# Patient Record
Sex: Female | Born: 1975 | Hispanic: Yes | Marital: Single | Smoking: Never smoker
Health system: Southern US, Community
[De-identification: ages and names within clinical notes are randomized; demographics above are authoritative.]

## PROBLEM LIST (undated history)

## (undated) ENCOUNTER — Inpatient Hospital Stay (HOSPITAL_COMMUNITY): Payer: Self-pay

## (undated) DIAGNOSIS — O039 Complete or unspecified spontaneous abortion without complication: Secondary | ICD-10-CM

## (undated) DIAGNOSIS — D649 Anemia, unspecified: Secondary | ICD-10-CM

## (undated) DIAGNOSIS — R519 Headache, unspecified: Secondary | ICD-10-CM

## (undated) DIAGNOSIS — R001 Bradycardia, unspecified: Secondary | ICD-10-CM

## (undated) DIAGNOSIS — T7840XA Allergy, unspecified, initial encounter: Secondary | ICD-10-CM

## (undated) DIAGNOSIS — N189 Chronic kidney disease, unspecified: Secondary | ICD-10-CM

## (undated) DIAGNOSIS — I8290 Acute embolism and thrombosis of unspecified vein: Secondary | ICD-10-CM

## (undated) DIAGNOSIS — R51 Headache: Secondary | ICD-10-CM

## (undated) HISTORY — PX: UPPER GI ENDOSCOPY: SHX6162

## (undated) HISTORY — DX: Acute embolism and thrombosis of unspecified vein: I82.90

## (undated) HISTORY — DX: Allergy, unspecified, initial encounter: T78.40XA

## (undated) HISTORY — PX: OVARIAN CYST SURGERY: SHX726

## (undated) HISTORY — PX: CHOLECYSTECTOMY: SHX55

---

## 2011-01-21 DIAGNOSIS — I8290 Acute embolism and thrombosis of unspecified vein: Secondary | ICD-10-CM

## 2011-01-21 HISTORY — DX: Acute embolism and thrombosis of unspecified vein: I82.90

## 2016-02-21 HISTORY — PX: DILATION AND CURETTAGE OF UTERUS: SHX78

## 2016-09-11 ENCOUNTER — Inpatient Hospital Stay (HOSPITAL_COMMUNITY): Payer: Medicaid Other

## 2016-09-11 ENCOUNTER — Inpatient Hospital Stay (HOSPITAL_COMMUNITY)
Admission: AD | Admit: 2016-09-11 | Discharge: 2016-09-11 | Disposition: A | Discharge status: Home / Self Care | Payer: Medicaid Other | Source: Ambulatory Visit | Attending: Obstetrics & Gynecology | Admitting: Obstetrics & Gynecology

## 2016-09-11 ENCOUNTER — Encounter (HOSPITAL_COMMUNITY): Payer: Self-pay | Admitting: *Deleted

## 2016-09-11 DIAGNOSIS — O209 Hemorrhage in early pregnancy, unspecified: Secondary | ICD-10-CM | POA: Insufficient documentation

## 2016-09-11 DIAGNOSIS — O021 Missed abortion: Secondary | ICD-10-CM

## 2016-09-11 DIAGNOSIS — D682 Hereditary deficiency of other clotting factors: Secondary | ICD-10-CM | POA: Insufficient documentation

## 2016-09-11 DIAGNOSIS — N189 Chronic kidney disease, unspecified: Secondary | ICD-10-CM | POA: Diagnosis not present

## 2016-09-11 DIAGNOSIS — R109 Unspecified abdominal pain: Secondary | ICD-10-CM

## 2016-09-11 DIAGNOSIS — Z3A08 8 weeks gestation of pregnancy: Secondary | ICD-10-CM | POA: Diagnosis not present

## 2016-09-11 DIAGNOSIS — O034 Incomplete spontaneous abortion without complication: Secondary | ICD-10-CM | POA: Insufficient documentation

## 2016-09-11 DIAGNOSIS — D699 Hemorrhagic condition, unspecified: Secondary | ICD-10-CM

## 2016-09-11 DIAGNOSIS — O26891 Other specified pregnancy related conditions, first trimester: Secondary | ICD-10-CM

## 2016-09-11 DIAGNOSIS — O26831 Pregnancy related renal disease, first trimester: Secondary | ICD-10-CM | POA: Insufficient documentation

## 2016-09-11 DIAGNOSIS — O99111 Other diseases of the blood and blood-forming organs and certain disorders involving the immune mechanism complicating pregnancy, first trimester: Secondary | ICD-10-CM | POA: Diagnosis not present

## 2016-09-11 HISTORY — DX: Chronic kidney disease, unspecified: N18.9

## 2016-09-11 HISTORY — DX: Anemia, unspecified: D64.9

## 2016-09-11 HISTORY — DX: Complete or unspecified spontaneous abortion without complication: O03.9

## 2016-09-11 HISTORY — DX: Bradycardia, unspecified: R00.1

## 2016-09-11 LAB — WET PREP, GENITAL
Clue Cells Wet Prep HPF POC: NONE SEEN
SPERM: NONE SEEN
TRICH WET PREP: NONE SEEN
Yeast Wet Prep HPF POC: NONE SEEN

## 2016-09-11 LAB — ABO/RH: ABO/RH(D): O POS

## 2016-09-11 LAB — URINALYSIS, ROUTINE W REFLEX MICROSCOPIC
Bilirubin Urine: NEGATIVE
Glucose, UA: NEGATIVE mg/dL
Hgb urine dipstick: NEGATIVE
KETONES UR: NEGATIVE mg/dL
Leukocytes, UA: NEGATIVE
Nitrite: NEGATIVE
PROTEIN: 30 mg/dL — AB
Specific Gravity, Urine: 1.024 (ref 1.005–1.030)
pH: 8 (ref 5.0–8.0)

## 2016-09-11 LAB — CBC
HEMATOCRIT: 33.5 % — AB (ref 36.0–46.0)
HEMOGLOBIN: 11.3 g/dL — AB (ref 12.0–15.0)
MCH: 27.2 pg (ref 26.0–34.0)
MCHC: 33.7 g/dL (ref 30.0–36.0)
MCV: 80.5 fL (ref 78.0–100.0)
Platelets: 211 10*3/uL (ref 150–400)
RBC: 4.16 MIL/uL (ref 3.87–5.11)
RDW: 14.1 % (ref 11.5–15.5)
WBC: 3.9 10*3/uL — AB (ref 4.0–10.5)

## 2016-09-11 LAB — POCT PREGNANCY, URINE: Preg Test, Ur: POSITIVE — AB

## 2016-09-11 LAB — HCG, QUANTITATIVE, PREGNANCY: hCG, Beta Chain, Quant, S: 112694 m[IU]/mL — ABNORMAL HIGH (ref <5)

## 2016-09-11 NOTE — Discharge Instructions (Signed)
Aborto incompleto (Incomplete Miscarriage) Un aborto espontneo es la prdida repentina de un beb en gestacin (feto) antes de la semana 20 del embarazo. En un aborto espontneo, partes del feto o la placenta (alumbramiento) permanecen en el cuerpo. El aborto espontneo puede ser Ardelia Mems experiencia que afecte emocionalmente a Geologist, engineering. Hable con su mdico si tiene preguntas sobre el aborto espontneo, el proceso de duelo y los planes futuros de [place]. CAUSAS  Algunos problemas cromosmicos pueden hacer imposible que el beb se desarrolle normalmente. Los problemas con los genes o cromosomas del beb son, en la mayora de los [place], el resultado de errores que se producen, al azar, cuando el embrin se divide y crece. Estos problemas no se heredan de los [place].  Infeccin en el cuello del tero.  Problemas hormonales.  Problemas en el cuello del tero, como tener un tero incompetente. Esto ocurre cuando los tejidos no son lo suficientemente fuertes como para Risk manager.  Problemas del tero, como un tero con forma anormal, los fibromas o anormalidades congnitas.  Ciertas enfermedades crnicas.  No fume, no beba alcohol, ni consuma drogas.  Traumatismos.  SNTOMAS  Sangrado o manchado vaginal, con o sin clicos o dolor.  Dolor o clicos en el abdomen o en la cintura.  Eliminacin de lquido, tejidos o cogulos grandes por la vagina.  DIAGNSTICO El [employer] har un examen fsico. Tambin le indicar una ecografa para confirmar el aborto. Es posible que se realicen anlisis de [place]. TRATAMIENTO  Generalmente se realiza un procedimiento de dilatacin y curetaje (D y C). Durante el procedimiento de dilatacin y curetaje, el cuello del tero se abre (dilata) y se retira todo resto de tejido fetal o placentario del tero.  Si hay una infeccin, le recetarn antibiticos. Posiblemente le receten otros medicamentos para reducir Occupational psychologist) el tamao del tero si hay  mucha hemorragia.  Si su tipo de sangre es Rh negativo y el del beb es Rh positivo, necesitar una inyeccin de inmunoglobulina Rho(D). Esta inyeccin proteger a los futuros bebs de tener problemas de compatibilidad Rh en futuros embarazos.  Probablemente le indiquen reposo. Esto significa que debe quedarse en cama y levantarse nicamente para ir al bao.  INSTRUCCIONES PARA EL CUIDADO EN EL HOGAR  Haga reposo segn las indicaciones del mdico.  Limite las actividades segn las indicaciones del mdico. Es posible que se le permita retomar las actividades livianas si no se le realiz un curetaje, Comptroller tratamiento adicional.  Lleve un registro de la cantidad de toallas sanitarias que [place] por da. Observe cun impregnadas (saturadas) estn. Registre esta informacin.  No  use tampones.  No se haga duchas vaginales ni tenga relaciones sexuales hasta que el mdico la autorice.  Asista a todas las citas de seguimiento para una nueva evaluacin y para Chief Strategy Officer.  Slo tome medicamentos de venta libre o recetados para Glass blower/designer, Health and safety inspector o bajar la fiebre, segn las indicaciones de su mdico.  [employer] antibiticos como le indic el mdico. Asegrese de que finaliza la prescripcin completa aunque se sienta mejor.  SOLICITE ATENCIN MDICA DE INMEDIATO SI:  Siente calambres intensos en el estmago, en la espalda o en el abdomen.  Le sube la fiebre sin motivo (asegrese de Museum/gallery curator las cifras).  Elimina cogulos grandes o tejidos (consrvelos para que el [place]).  La hemorragia aumenta.  Se siente mareada, dbil o tiene episodios de desmayo.  ASEGRESE DE QUE:  Comprende estas instrucciones.  Controlar su afeccin.  Recibir ayuda de inmediato si no mejora o si empeora.  Esta informacin no tiene Marine scientist el consejo del mdico. Asegrese de hacerle al mdico cualquier pregunta que tenga. Document Released: 01/06/2005  Document Revised: 10/27/2012 Document Reviewed: 08/05/2012 Elsevier Interactive Patient Education  2017 [place] y curetaje o curetaje por aspiracin (Dilation and Curettage or Vacuum Curettage) La dilatacin y el curetaje por aspiracin son procedimientos menores. Consiste en la apertura (dilatacin) del cuello del tero y el raspado (curetaje) en la superficie interna del tero. Durante este procedimiento, el tejido del interior del tero se raspa suavemente. Durante el curetaje por aspiracin, se utiliza una succin suave para extirpar tejidos del tero.  El curetaje podr realizarse para diagnstico o para tratar un problema. Como procedimiento diagnstico, se realiza para examinar los tejidos del tero. Un diagnstico con curetaje se realiza en caso de presentarse los siguientes sntomas:   Sangrado irregular en el tero.  Hemorragias con cogulos.  Prdida de Microsoft perodos [place].  Perodos menstruales prolongados.  Sangrado luego de la menopausia.  Falta de periodo menstrual (amenorrea).  Cambio en el tamao y forma del tero. [employer], el curetaje podr realizarse por las siguientes causas:   Remocin del DIU (dispositivo intrauterino).  Remocin de placenta retenida luego del parto. La placenta retenida puede causar una hemorragia lo suficientemente grave como para requerir transfusiones o Astronomer una infeccin.  Aborto.  Aborto espontneo.  Extirpacin de plipos en el interior del tero.  Extirpacin de fibromas no frecuentes (bultos no cancerosos). INFORME A SU MDICO:   Cualquier alergia que tenga.  Todos los [employer] [place], incluyendo vitaminas, hierbas, gotas oftlmicas, cremas y medicamentos de venta libre.  Problemas previos que usted o los [employer]Health de su familia hayan tenido con el uso de anestsicos.  Enfermedades de [employer].  Cirugas previas.  Padecimientos mdicos. RIESGOS Y  COMPLICACIONES  Generalmente es un procedimiento seguro. Sin embargo, Games developer procedimiento, pueden surgir complicaciones. Las complicaciones posibles son:  Elvina Mattes.  Infeccin en el tero.  Lesin en el cuello del tero.  Desarrollo de tejido Pensions consultant (adherencias) dentro del tero que posteriormente causan hemorragia menstrual en cantidad anormal.  Complicaciones de la anestesia general, si se ha usado.  Perforacin del tero. Esto es raro. ANTES DEL PROCEDIMIENTO   Coma y beba antes del procedimiento slo lo que le indique el mdico.  Arregle con alguna persona para que la lleve a su casa. PROCEDIMIENTO  El procedimiento demora entre 15 y 46 minutos.  Podrn administrarle uno de los siguientes medicamentos: ? Medicamentos que adormecen el rea del cuello uterino (anestesia local). ? Un medicamento para que duerma durante el procedimiento (anestesia general).  Deber recostarse sobre la espalda con las piernas en los estribos.  Le colocarn en la vagina un instrumento metlico o plstico entibiado espculo) para [place] y permitir al profesional visualizar el cuello del tero.  [employer] formas en las que el cuello del tero puede ser ablandado y dilatado. Pueden ser: ? Tomar medicamentos. ? Insercin de unas varillas delgadas (laminarias) en el cuello del tero.  Se utilizar un instrumento curvo (cureta)para raspar las clulas de la membrana que cubre el interior del tero. En algunos casos, se aplica una succin suave con la cureta. Luego la cureta se Charity fundraiser. DESPUS DEL PROCEDIMIENTO   Descansar en una sala de recuperacin hasta que se sienta estable y lista para volver a su casa.  Podr tener nuseas o vmitos si  le han administrado anestesia general.  Education officer, environmental de garganta si le han colocado un tubo durante la anestesia general.  Podr sentir algunos clicos y Lucilla Edin pequea hemorragia. Estas molestias pueden durar entre 2  das y 2 semanas despus del procedimiento.  Luego del procedimiento, el tero formar [place] Esto puede hacer que el prximo perodo se retrase. Esta informacin no tiene Marine scientist el consejo del mdico. Asegrese de hacerle al mdico cualquier pregunta que tenga. Document Released: 06/25/2007 Document Revised: 09/08/2012 Elsevier Interactive Patient Education  2017 [place] recurrente del embarazo (Recurrent Pregnancy Loss) La prdida recurrente del embarazo es la prdida de tres o ms embarazos antes de las 20semanas de Armed forces logistics/support/administrative officer. Perder tres o ms embarazos consecutivos es poco frecuente. CAUSAS La causa ms frecuente de prdida recurrente del embarazo es una cantidad anormal de cromosomas en el beb en desarrollo (feto). Los cromosomas son las estructuras internas de las clulas que contienen todo Agricultural engineer gentico. En la mayora de los casos de prdida recurrente del Media planner, un cromosoma faltante o adicional impide que el beb se siga desarrollando. Tal vez no sea posible identificar cul es el cromosoma defectuoso. Las anormalidades cromosmicas pueden ser heredadas, pero la [place] de las veces ocurren al azar. Otras causas posibles de prdida recurrente del embarazo incluyen:  Haber nacido con un tero con estructura anormal (tero septado).  Tener crecimientos no cancerosos en el tero (fibromas o plipos).  Tener una enfermedad que causa cicatrizacin en el tero (sndrome de [place]).  Tener una enfermedad que hace que la sangre se coagule (sndrome de anticuerpos antifosfolpidos).  Tener una enfermedad que aumenta las hemorragias (trombofilia). FACTORES DE RIESGO El riesgo de prdida recurrente del embarazo aumenta a medida que envejece. Otros factores de riesgo son:  Diabetes.  Enfermedad tiroidea.  Obesidad.  Fumar.  Consumo excesivo de alcohol o cafena.  El consumo de drogas. SIGNOS Y SNTOMAS  Hemorragia  vaginal.  Eliminar cogulos vaginales.  Dolor o clicos abdominales.  Dolor lumbar. DIAGNSTICO Su mdico puede obtener una imagen del tero mediante ondas sonoras y [place] computadora (ecografa) para Nurse, mental health a las 5 o 6semanas de la concepcin. La ecografa tambin sirve para confirmar la prdida del [place]. El mdico puede hacerle un examen fsico completo, incluso de la vagina y el tero (examen plvico), para hallar las posibles causas de la prdida recurrente del embarazo. Otras pruebas son:  Ecografa para saber si la estructura del tero es normal o si tiene plipos o fibromas.  Anlisis de sangre para saber si tiene una enfermedad que produce prdida recurrente del embarazo.  Anlisis de sangre para saber si la coagulacin es normal.  Anlisis genticos a usted y Surveyor, minerals. TRATAMIENTO En muchos casos, no existe un tratamiento especfico. Segn la causa, los posibles tratamientos incluyen:  Pensions consultant los vulos fuera del tero (fertilizacin in vitro). Con este tratamiento, el mdico puede [employer] vulos sin anormalidades cromosmicas.  Tomar un anticoagulante para prevenir la coagulacin si tiene sndrome de anticuerpos antifosfolpidos.  Someterse a una ciruga correctiva si tiene una anormalidad en el tero. INSTRUCCIONES PARA EL CUIDADO EN EL HOGAR Siga cuidadosamente todas las indicaciones del mdico. Esto puede incluir:  No fume ni consuma drogas.  Si est embarazada, no beba alcohol.  No se ponga nada en la vagina ni tenga relaciones sexuales [employer] semanas despus de un aborto espontneo.  Si tiene sangre Rh negativo y tuvo un aborto espontneo, tal vez necesite recibir inmunoglobulina  Rho (D). Consulte a su mdico si necesita recibir esta vacuna.  Si no desea quedar embarazada, utilice anticonceptivos. Puede quedar [employer] despus de un aborto espontneo.  Obtenga ayuda de amigos y seres queridos. Un aborto espontneo  puede ser triste y Arboriculturist. SOLICITE ATENCIN MDICA SI:  Tiene una ligera hemorragia o manchado vaginal durante el embarazo.  Ha intentado quedar embarazada sin xito.  Tiene problemas de tristeza o depresin despus de un aborto espontneo. SOLICITE ATENCIN MDICA DE INMEDIATO SI:  Tiene hemorragia vaginal abundante y calambres abdominales.  Tiene fiebre, escalofros y dolor abdominal intenso. Esta informacin no tiene Marine scientist el consejo del mdico. Asegrese de hacerle al mdico cualquier pregunta que tenga. Document Released: 06/25/2007 Document Revised: 01/11/2013 Document Reviewed: 10/29/2012 Elsevier Interactive Patient Education  2018 [employer].

## 2016-09-11 NOTE — MAU Note (Signed)
Patient recently moved from [place].  She had her pregnancy verification in [place] about a month ago and states she is [redacted]weeks pregnant.  Her LMP was 07/11/16.  Having intermittent lower abdominal pain that is sharp and bilateral.  Denies vaginal bleeding or discharge.  Also c/o nausea.

## 2016-09-11 NOTE — MAU Provider Note (Signed)
History  Chief Complaint:  Abdominal Cramping  Darlene Duncan is a 41 y.o. [id number] female at [redacted]w[redacted]d presenting with LLQ abdominal pain. She states the pain started about 2 weeks ago, and describes it is sharp, intermittent, occurring 4-5x/day. She denies vaginal bleeding, discharge, irritation or pain. She recently moved from [place] and had pregnancy confirmation 22mo prior. LMP reported as 07/11/16. She also endorses nausea and a bitter taste in her mouth. She states that she has not vomited. She denies fevers, chills, headaches, blurry vision, urinary symptoms, SOB, chest pain.   She reports a previous missed SAB in February of this year, confirmed by sonography. During this SAB, she had multiple visits with [place] confirming presence of remnant fetal tissue, and was given Cytotec x3 to facilitate removal of products of conception. She decided not to take the cytotec as she felt she was actively aborting the baby. Due to this, she had to undergo D&E to avoid further complications. 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    She has not established with a provider for prenatal care.  Obstetrical History: OB History    Gravida Para Term Preterm AB Living   5 2 2  0 2 2   SAB TAB Ectopic Multiple Live Births   2 0 0 0 2      Past Medical History: Past Medical History:  Diagnosis Date  . Anemia   . Bradycardia    no meds  . Chronic kidney disease    cyst  . Factor VII deficiency (HCC)   . Miscarriage     Past Surgical History: Past Surgical History:  Procedure Laterality Date  . CHOLECYSTECTOMY    . DILATION AND CURETTAGE OF UTERUS    . OVARIAN CYST SURGERY      Social History: Social History   Social History  . Marital status: Single    Spouse name: N/A  . Number of children: N/A  . Years of education: N/A   Social History Main Topics  . Smoking status: Never Smoker  . Smokeless tobacco: Never Used  .  Alcohol use Yes     Comment: rarely  . Drug use: No  . Sexual activity: Yes    Birth control/ protection: None   Other Topics Concern  . None   Social History Narrative  . None    Allergies: No Known Allergies  No prescriptions prior to admission.    Review of Systems  Pertinent pos/neg as indicated in HPI  Physical Exam  Blood pressure (!) 95/59, pulse 64, temperature 98.2 F (36.8 C), temperature source Oral, resp. rate 16, height 5\' 6"  (1.676 m), weight 64.2 kg (141 lb 8 oz), last menstrual period 07/11/2016, SpO2 98 %. General appearance: alert, cooperative and no distress Lungs: normal work of breathing Abdomen: gravid, soft, non-tender Extremities: no edema Spec exam: Pelvic exam: VULVA: normal appearing vulva with no masses, tenderness or lesions,  VAGINA: normal appearing vagina with normal color and discharge, no lesions,  CERVIX: anteriorly positioned, normal appearing without discharge or lesions, no active bleeding UTERUS: uterus is mildly enlarged and nontender,  ADNEXA: normal adnexa in size, nontender and no masses, exam chaperoned by Dorathy Kinsman, CNA.  Cultures/Specimens: G/C and Wet mount obtained  MAU Course  MDM History and physical examination with speculum exam UA/ CBC/ CMP OB abdominal/transvaginal US  Labs:  Results for orders placed or performed during the hospital encounter of 09/11/16 (from the  past 24 hour(s))  Urinalysis, Routine w reflex microscopic     Status: Abnormal   Collection Time: 09/11/16 10:19 AM  Result Value Ref Range   Color, Urine YELLOW YELLOW   APPearance CLEAR CLEAR   Specific Gravity, Urine 1.024 1.005 - 1.030   pH 8.0 5.0 - 8.0   Glucose, UA NEGATIVE NEGATIVE mg/dL   Hgb urine dipstick NEGATIVE NEGATIVE   Bilirubin Urine NEGATIVE NEGATIVE   Ketones, ur NEGATIVE NEGATIVE mg/dL   Protein, ur 30 (A) NEGATIVE mg/dL   Nitrite NEGATIVE NEGATIVE   Leukocytes, UA NEGATIVE NEGATIVE   RBC / HPF 0-5 0 - 5 RBC/hpf    WBC, UA 0-5 0 - 5 WBC/hpf   Bacteria, UA RARE (A) NONE SEEN   Squamous Epithelial / LPF 0-5 (A) NONE SEEN   Mucus PRESENT   Pregnancy, urine POC     Status: Abnormal   Collection Time: 09/11/16 10:39 AM  Result Value Ref Range   Preg Test, Ur POSITIVE (A) NEGATIVE  CBC     Status: Abnormal   Collection Time: 09/11/16 11:24 AM  Result Value Ref Range   WBC 3.9 (L) 4.0 - 10.5 K/uL   RBC 4.16 3.87 - 5.11 MIL/uL   Hemoglobin 11.3 (L) 12.0 - 15.0 g/dL   HCT 16.1 (L) 09.6 - 04.5 %   MCV 80.5 78.0 - 100.0 fL   MCH 27.2 26.0 - 34.0 pg   MCHC 33.7 30.0 - 36.0 g/dL   RDW 40.9 81.1 - 91.4 %   Platelets 211 150 - 400 K/uL   Imaging:  OB Transvaginal/ abdominal US: IMPRESSION: 1. Single intrauterine gestation identified. There is no cardiac activity identified. Findings meet definitive criteria for failed pregnancy. This follows SRU consensus guidelines: Diagnostic Criteria for Nonviable Pregnancy Early in the First Trimester. [facility] [phone]. 2. Small subchorionic hemorrhage.  Assessment and Plan  A: Darlene Duncan is a 41yoF [id number] [redacted]w[redacted]d SIUP presenting with LLQ abdominal pain concerning for SAB. Abdominal/Transvaginal US confirmed a single IUP with no cardiac activity, indicating a failed pregnancy and missed miscarriage. Given the patients complex OB history of SAB, refusal to take medications for SAB and prior D&E in the setting of Factor VII deficiency, elective D&E would likely be the safest method of facilitating expulsion of retained products of conception. Patient's known history of bleeding disorder is concerning in the setting of potential hemorrhage at home or hemorrhage if Cytotec is given to facilitate contractions. After an extensive discussion with the patient, she elected to return home over the weekend discuss the situation with her boyfriend and likely return for scheduled D&E on Monday.     The patients social situation is complicated by her recent move from  [place]. She currently does not have any family or acquaintances in the area, and lives alone with her 2 children. Her boyfriend will return on Sunday.   P:  Incomplete SAB: D&E to be scheduled next Monday. Patient is currently in stable condition with no active bleeding or pain, and appropriate for discharge. Strict return precautions given regarding worsening symptoms and bleeding, with the patient advised to call EMS for any significant change. Patient advised to meet and contact her neighbors and inform them of the situation so that they may be able to assist in the event of an emergency. Patient to establish with [facility] clinic for continual care and fertility assessment.     Dannette Barbara, Medical Student 8/23/201811:40 AM   I confirm that I have verified  the information documented in the medical students's note and that I have also personally reperformed the physical exam and all medical decision making activities.  Katrinka Blazing, [place], CNM 09/11/2016 3:12 PM

## 2016-09-12 ENCOUNTER — Telehealth (HOSPITAL_COMMUNITY): Payer: Self-pay

## 2016-09-12 LAB — HIV ANTIBODY (ROUTINE TESTING W REFLEX): HIV SCREEN 4TH GENERATION: NONREACTIVE

## 2016-09-12 LAB — GC/CHLAMYDIA PROBE AMP (~~LOC~~) NOT AT ARMC
Chlamydia: NEGATIVE
NEISSERIA GONORRHEA: NEGATIVE

## 2016-09-12 NOTE — Telephone Encounter (Signed)
-----   Message from Adam Phenix, MD sent at 09/11/2016  3:02 PM EDT ----- Please schedule suction D&C 8/27

## 2016-09-12 NOTE — Telephone Encounter (Signed)
Called and spoke w/ Darlene Duncan, attempted to give her the date and time for her surgery, patient stated that she was not coming that she doesn't feel well and does not want to abort her baby. Patient given the phone number to [id number] to call and make an appointment asap to discuss other options. Message routed to Dr. Debroah Loop

## 2016-09-15 ENCOUNTER — Encounter (HOSPITAL_COMMUNITY): Admission: AD | Payer: Self-pay | Source: Ambulatory Visit

## 2016-09-15 ENCOUNTER — Telehealth: Payer: Self-pay | Admitting: Obstetrics and Gynecology

## 2016-09-15 ENCOUNTER — Ambulatory Visit (HOSPITAL_COMMUNITY)
Admission: AD | Admit: 2016-09-15 | Payer: Medicaid Other | Source: Ambulatory Visit | Admitting: Obstetrics & Gynecology

## 2016-09-15 SURGERY — DILATION AND EVACUATION, UTERUS
Anesthesia: Choice

## 2016-09-15 NOTE — Telephone Encounter (Signed)
Patient want to talk to a nurse, she state that she is still not feeing well and having questions regarding her pregnancy.

## 2016-09-15 NOTE — Telephone Encounter (Signed)
Patient has already been scheduled for a follow up appt to discuss her options. She would like to wait a little longer before going forward with the D/C.

## 2016-09-23 ENCOUNTER — Encounter: Payer: Self-pay | Admitting: Obstetrics and Gynecology

## 2016-09-23 ENCOUNTER — Ambulatory Visit (INDEPENDENT_AMBULATORY_CARE_PROVIDER_SITE_OTHER): Payer: Medicaid Other | Admitting: Obstetrics and Gynecology

## 2016-09-23 DIAGNOSIS — O021 Missed abortion: Secondary | ICD-10-CM | POA: Insufficient documentation

## 2016-09-23 DIAGNOSIS — D682 Hereditary deficiency of other clotting factors: Secondary | ICD-10-CM | POA: Insufficient documentation

## 2016-09-23 NOTE — Progress Notes (Signed)
Here to discuss options including D&c. C/o tooth pain, sometimes headache.

## 2016-09-23 NOTE — Progress Notes (Signed)
Patient ID: Darlene Duncan, female   DOB: 03/27/1975, 41 y.o.   MRN: [id number] Pt here to discuss her options for missed AB. Dx by U/S. Pt with H/O AB x 2. She has used Cytotec in the past without problems. [place]. Did not want to use Cytotec and waited instead. MD proceeded to D & C, noted some increased bleeding at time of D & C. Heme/Onc work up confirmed Factor 7 def. Heme/Onc said no treatment needed but just be aware of increased risk of bleeding. Was scheduled for D & C last week but canceled. States she just was not ready to have surgery.  Tx options reviewed with pt. R/B of each discussed. Pt has limited support here. FOB is a truck driver and is out of town often. She is concerned about the potential for increased bleeding at home with no support.   She desires to proceed with D & C this week, as FOB is in town.  A/P  MAB  Will schedule for D & C this week. F/U post op

## 2016-09-24 ENCOUNTER — Encounter (HOSPITAL_COMMUNITY): Payer: Self-pay | Admitting: *Deleted

## 2016-09-24 ENCOUNTER — Encounter (HOSPITAL_COMMUNITY): Payer: Self-pay

## 2016-09-24 ENCOUNTER — Encounter: Payer: Self-pay | Admitting: Obstetrics and Gynecology

## 2016-09-25 DIAGNOSIS — O021 Missed abortion: Secondary | ICD-10-CM

## 2016-09-25 NOTE — H&P (Signed)
Darlene Duncan is an 41 y.o. female [id number] with missed AB Dx bu U/S on 09/11/16. CRL 8 3/7. Pt with Factor VII def, currently on no treatment.   Menstrual History: Menarche age: [age] Patient's last menstrual period was 07/11/2016 (exact date).    Past Medical History:  Diagnosis Date  . Anemia   . Bradycardia    no meds  . Chronic kidney disease    cyst  . Factor VII deficiency (HCC)   . Headache    otc med  . Miscarriage     Past Surgical History:  Procedure Laterality Date  . CESAREAN SECTION     x 2  . CHOLECYSTECTOMY    . DILATION AND CURETTAGE OF UTERUS  02/2016   MAB  . OVARIAN CYST SURGERY    . UPPER GI ENDOSCOPY      History reviewed. No pertinent family history.  Social History:  reports that she has never smoked. She has never used smokeless tobacco. She reports that she drinks alcohol. She reports that she does not use drugs.  Allergies: No Known Allergies  No prescriptions prior to admission.    Review of Systems  Constitutional: Negative.   Respiratory: Negative.   Cardiovascular: Negative.   Gastrointestinal: Negative.   Genitourinary: Negative.     Height 5\' 6"  (1.676 m), weight 140 lb (63.5 kg), last menstrual period 07/11/2016. Physical Exam  Constitutional: She appears well-developed and well-nourished.  Cardiovascular: Normal rate and regular rhythm.   Respiratory: Effort normal and breath sounds normal.  GI: Soft. Bowel sounds are normal.  Genitourinary:  Genitourinary Comments: Deferred to OR    No results found for this or any previous visit (from the past 24 hour(s)).  No results found.  Assessment/Plan: Missed AB Factor VII def  Pt scheduled for D & C. R/B/Post op care reviewed with pt. Pt has verbalized understanding and desires to proceed.   Hermina StaggersMichael L Chakira Jachim 09/25/2016, 2:26 PM

## 2016-09-26 ENCOUNTER — Encounter (HOSPITAL_COMMUNITY)
Admission: RE | Disposition: A | Discharge status: Home / Self Care | Payer: Self-pay | Source: Ambulatory Visit | Attending: Obstetrics and Gynecology

## 2016-09-26 ENCOUNTER — Ambulatory Visit (HOSPITAL_COMMUNITY)
Admission: RE | Admit: 2016-09-26 | Discharge: 2016-09-26 | Disposition: A | Discharge status: Home / Self Care | Payer: Medicaid Other | Source: Ambulatory Visit | Attending: Obstetrics and Gynecology | Admitting: Obstetrics and Gynecology

## 2016-09-26 ENCOUNTER — Encounter (HOSPITAL_COMMUNITY): Payer: Self-pay

## 2016-09-26 ENCOUNTER — Ambulatory Visit (HOSPITAL_COMMUNITY): Payer: Medicaid Other | Admitting: Anesthesiology

## 2016-09-26 DIAGNOSIS — O99111 Other diseases of the blood and blood-forming organs and certain disorders involving the immune mechanism complicating pregnancy, first trimester: Secondary | ICD-10-CM | POA: Diagnosis not present

## 2016-09-26 DIAGNOSIS — D682 Hereditary deficiency of other clotting factors: Secondary | ICD-10-CM | POA: Diagnosis not present

## 2016-09-26 DIAGNOSIS — O26831 Pregnancy related renal disease, first trimester: Secondary | ICD-10-CM | POA: Insufficient documentation

## 2016-09-26 DIAGNOSIS — O021 Missed abortion: Secondary | ICD-10-CM | POA: Insufficient documentation

## 2016-09-26 DIAGNOSIS — N189 Chronic kidney disease, unspecified: Secondary | ICD-10-CM | POA: Insufficient documentation

## 2016-09-26 HISTORY — PX: DILATION AND EVACUATION: SHX1459

## 2016-09-26 HISTORY — DX: Headache: R51

## 2016-09-26 HISTORY — DX: Headache, unspecified: R51.9

## 2016-09-26 LAB — CBC
HCT: 36.3 % (ref 36.0–46.0)
Hemoglobin: 12.2 g/dL (ref 12.0–15.0)
MCH: 27 pg (ref 26.0–34.0)
MCHC: 33.6 g/dL (ref 30.0–36.0)
MCV: 80.3 fL (ref 78.0–100.0)
PLATELETS: 230 10*3/uL (ref 150–400)
RBC: 4.52 MIL/uL (ref 3.87–5.11)
RDW: 14.1 % (ref 11.5–15.5)
WBC: 4.9 10*3/uL (ref 4.0–10.5)

## 2016-09-26 LAB — TYPE AND SCREEN
ABO/RH(D): O POS
Antibody Screen: NEGATIVE

## 2016-09-26 SURGERY — DILATION AND EVACUATION, UTERUS
Anesthesia: Monitor Anesthesia Care

## 2016-09-26 MED ORDER — MISOPROSTOL 200 MCG PO TABS
ORAL_TABLET | ORAL | Status: DC | PRN
  Administered 2016-09-26: 200 ug via ORAL

## 2016-09-26 MED ORDER — LACTATED RINGERS IV SOLN: INTRAVENOUS | Status: DC

## 2016-09-26 MED ORDER — SCOPOLAMINE 1 MG/3DAYS TD PT72: 1.0000 | MEDICATED_PATCH | TRANSDERMAL | Status: DC

## 2016-09-26 MED ORDER — FENTANYL CITRATE (PF) 100 MCG/2ML IJ SOLN
INTRAMUSCULAR | Status: DC | PRN
  Administered 2016-09-26: 100 ug via INTRAVENOUS

## 2016-09-26 MED ORDER — DEXAMETHASONE SODIUM PHOSPHATE 10 MG/ML IJ SOLN
INTRAMUSCULAR | Status: DC | PRN
  Administered 2016-09-26: 4 mg via INTRAVENOUS

## 2016-09-26 MED ORDER — SCOPOLAMINE 1 MG/3DAYS TD PT72
MEDICATED_PATCH | TRANSDERMAL | Status: DC
Start: 2016-09-26 — End: 2016-09-26
  Administered 2016-09-26: 1.5 mg via TRANSDERMAL
  Filled 2016-09-26: qty 1

## 2016-09-26 MED ORDER — BUPIVACAINE HCL (PF) 0.25 % IJ SOLN
INTRAMUSCULAR | Status: AC
  Filled 2016-09-26: qty 30

## 2016-09-26 MED ORDER — SOD CITRATE-CITRIC ACID 500-334 MG/5ML PO SOLN
30.0000 mL | ORAL | Status: AC
  Administered 2016-09-26: 30 mL via ORAL

## 2016-09-26 MED ORDER — PROPOFOL 500 MG/50ML IV EMUL
INTRAVENOUS | Status: DC | PRN
  Administered 2016-09-26 (×2): 40 mg via INTRAVENOUS
  Administered 2016-09-26: 30 mg via INTRAVENOUS
  Administered 2016-09-26: 20 mg via INTRAVENOUS
  Administered 2016-09-26: 40 mg via INTRAVENOUS

## 2016-09-26 MED ORDER — LIDOCAINE HCL (CARDIAC) 20 MG/ML IV SOLN
INTRAVENOUS | Status: DC | PRN
  Administered 2016-09-26: 50 mg via INTRAVENOUS

## 2016-09-26 MED ORDER — ONDANSETRON HCL 4 MG/2ML IJ SOLN
INTRAMUSCULAR | Status: DC | PRN
  Administered 2016-09-26: 4 mg via INTRAVENOUS

## 2016-09-26 MED ORDER — MISOPROSTOL 200 MCG PO TABS
ORAL_TABLET | ORAL | Status: AC
  Filled 2016-09-26: qty 1

## 2016-09-26 MED ORDER — SOD CITRATE-CITRIC ACID 500-334 MG/5ML PO SOLN
ORAL | Status: AC
  Administered 2016-09-26: 30 mL via ORAL
  Filled 2016-09-26: qty 15

## 2016-09-26 MED ORDER — LACTATED RINGERS IV SOLN
INTRAVENOUS | Status: DC
  Administered 2016-09-26: 10:00:00 via INTRAVENOUS
  Administered 2016-09-26: 1000 mL via INTRAVENOUS

## 2016-09-26 MED ORDER — ONDANSETRON HCL 4 MG/2ML IJ SOLN
INTRAMUSCULAR | Status: AC
  Filled 2016-09-26: qty 2

## 2016-09-26 MED ORDER — HYDROCODONE-ACETAMINOPHEN 5-325 MG PO TABS: 1.0000 | ORAL_TABLET | Freq: Four times a day (QID) | ORAL | 0 refills | Status: DC | PRN

## 2016-09-26 MED ORDER — FENTANYL CITRATE (PF) 100 MCG/2ML IJ SOLN
INTRAMUSCULAR | Status: AC
  Filled 2016-09-26: qty 2

## 2016-09-26 MED ORDER — DEXAMETHASONE SODIUM PHOSPHATE 4 MG/ML IJ SOLN
INTRAMUSCULAR | Status: AC
  Filled 2016-09-26: qty 1

## 2016-09-26 MED ORDER — MIDAZOLAM HCL 2 MG/2ML IJ SOLN
INTRAMUSCULAR | Status: AC
  Filled 2016-09-26: qty 2

## 2016-09-26 MED ORDER — GLYCOPYRROLATE 0.2 MG/ML IJ SOLN
INTRAMUSCULAR | Status: DC | PRN
  Administered 2016-09-26: 0.2 mg via INTRAVENOUS

## 2016-09-26 MED ORDER — FERRIC SUBSULFATE 259 MG/GM EX SOLN
CUTANEOUS | Status: AC
  Filled 2016-09-26: qty 8

## 2016-09-26 MED ORDER — SCOPOLAMINE 1 MG/3DAYS TD PT72
1.0000 | MEDICATED_PATCH | Freq: Once | TRANSDERMAL | Status: DC
  Administered 2016-09-26: 1.5 mg via TRANSDERMAL

## 2016-09-26 MED ORDER — MIDAZOLAM HCL 2 MG/2ML IJ SOLN
INTRAMUSCULAR | Status: DC | PRN
  Administered 2016-09-26: 2 mg via INTRAVENOUS

## 2016-09-26 MED ORDER — KETOROLAC TROMETHAMINE 30 MG/ML IJ SOLN
INTRAMUSCULAR | Status: DC | PRN
  Administered 2016-09-26: 30 mg via INTRAVENOUS

## 2016-09-26 MED ORDER — BUPIVACAINE HCL 0.25 % IJ SOLN
INTRAMUSCULAR | Status: DC | PRN
  Administered 2016-09-26: 10 mL

## 2016-09-26 MED ORDER — KETOROLAC TROMETHAMINE 30 MG/ML IJ SOLN
INTRAMUSCULAR | Status: AC
  Filled 2016-09-26: qty 1

## 2016-09-26 MED ORDER — ACETAMINOPHEN 500 MG PO TABS
1000.0000 mg | ORAL_TABLET | Freq: Four times a day (QID) | ORAL | Status: AC | PRN
  Administered 2016-09-26: 1000 mg via ORAL

## 2016-09-26 MED ORDER — LIDOCAINE HCL (CARDIAC) 20 MG/ML IV SOLN
INTRAVENOUS | Status: AC
  Filled 2016-09-26: qty 5

## 2016-09-26 MED ORDER — ACETAMINOPHEN 500 MG PO TABS
ORAL_TABLET | ORAL | Status: AC
  Administered 2016-09-26: 1000 mg via ORAL
  Filled 2016-09-26: qty 2

## 2016-09-26 MED ORDER — PROPOFOL 10 MG/ML IV BOLUS
INTRAVENOUS | Status: AC
  Filled 2016-09-26: qty 20

## 2016-09-26 SURGICAL SUPPLY — 23 items
CATH ROBINSON RED A/P 16FR (CATHETERS) ×3 IMPLANT
CLOTH BEACON ORANGE TIMEOUT ST (SAFETY) ×3 IMPLANT
DECANTER SPIKE VIAL GLASS SM (MISCELLANEOUS) ×3 IMPLANT
GLOVE BIO SURGEON STRL SZ 6.5 (GLOVE) ×2 IMPLANT
GLOVE BIO SURGEON STRL SZ7.5 (GLOVE) ×3 IMPLANT
GLOVE BIO SURGEONS STRL SZ 6.5 (GLOVE) ×1
GLOVE BIOGEL PI IND STRL 7.0 (GLOVE) ×3 IMPLANT
GLOVE BIOGEL PI INDICATOR 7.0 (GLOVE) ×6
GOWN STRL REUS W/TWL LRG LVL3 (GOWN DISPOSABLE) ×3 IMPLANT
GOWN STRL REUS W/TWL XL LVL3 (GOWN DISPOSABLE) ×3 IMPLANT
KIT BERKELEY 1ST TRIMESTER 3/8 (MISCELLANEOUS) ×3 IMPLANT
NS IRRIG 1000ML POUR BTL (IV SOLUTION) ×3 IMPLANT
PACK VAGINAL MINOR WOMEN LF (CUSTOM PROCEDURE TRAY) ×3 IMPLANT
PAD OB MATERNITY 4.3X12.25 (PERSONAL CARE ITEMS) ×3 IMPLANT
PAD PREP 24X48 CUFFED NSTRL (MISCELLANEOUS) ×3 IMPLANT
SCOPETTES 8  STERILE (MISCELLANEOUS) ×2
SCOPETTES 8 STERILE (MISCELLANEOUS) ×1 IMPLANT
SET BERKELEY SUCTION TUBING (SUCTIONS) ×3 IMPLANT
TOWEL OR 17X24 6PK STRL BLUE (TOWEL DISPOSABLE) ×6 IMPLANT
VACURETTE 10 RIGID CVD (CANNULA) IMPLANT
VACURETTE 7MM CVD STRL WRAP (CANNULA) IMPLANT
VACURETTE 8 RIGID CVD (CANNULA) ×3 IMPLANT
VACURETTE 9 RIGID CVD (CANNULA) IMPLANT

## 2016-09-26 NOTE — Op Note (Signed)
[facility] PROCEDURE DATE: 09/26/2016  PREOPERATIVE DIAGNOSIS: 8 week missed abortion POSTOPERATIVE DIAGNOSIS: The same PROCEDURE:     Dilation and Evacuation SURGEON:  Dr. Casimiro NeedleMichael L. Ervin  INDICATIONS: 41 y.o. [id number] with MAB at [redacted] weeks gestation, needing surgical completion.  Risks of surgery were discussed with the patient including but not limited to: bleeding which may require transfusion; infection which may require antibiotics; injury to uterus or surrounding organs; need for additional procedures including laparotomy or laparoscopy; possibility of intrauterine scarring which may impair future fertility; and other postoperative/anesthesia complications. Written informed consent was obtained.    FINDINGS:  A 10 week size uterus, moderate amounts of products of conception, specimen sent to pathology.  ANESTHESIA:    Monitored intravenous sedation, paracervical block. INTRAVENOUS FLUIDS:  100 ml of LR ESTIMATED BLOOD LOSS:  50 ml. SPECIMENS:  Products of conception sent to pathology COMPLICATIONS:  None immediate.  PROCEDURE DETAILS:  The patient was then taken to the operating room where monitored intravenous sedation was administered and was found to be adequate.  After an adequate timeout was performed, she was placed in the dorsal lithotomy position and examined; then prepped and draped in the sterile manner.   Her bladder was catheterized for an unmeasured amount of clear, yellow urine. A vaginal speculum was then placed in the patient's vagina and a single tooth tenaculum was applied to the anterior lip of the cervix.  A paracervical block using 10 ml of 0.5% Marcaine was administered. The cervix was gently dilated to accommodate a 8 mm suction curette that was gently advanced to the uterine fundus.  The suction device was then activated and curette slowly rotated to clear the uterus of products of conception.  A sharp curettage was then performed to confirm complete emptying of  the uterus. There was minimal bleeding noted and the tenaculum removed with good hemostasis noted.  Monsel's solution was applied to the cervix. 200 mcg Cytotec was placed rectal at conclusion of the case.  All instruments were removed from the patient's vagina.  Sponge and instrument counts were correct times two  The patient tolerated the procedure well and was taken to the recovery area awake, and in stable condition.   The patient will be discharged to home as per PACU criteria.  Routine postoperative instructions given.  She was prescribed Percocet and Ibuprofen.  She will follow up in the clinic on 3-4 weeks for postoperative evaluation.   Michael L. Alysia PennaErvin, MD, FACOG Attending Obstetrician & Gynecologist Faculty Practice, [facility]

## 2016-09-26 NOTE — Transfer of Care (Signed)
Immediate Anesthesia Transfer of Care Note  Patient: Darlene Duncan  Procedure(s) Performed: Procedure(s): DILATATION AND EVACUATION FOR MISSED AB (N/A)  Patient Location: PACU  Anesthesia Type:MAC  Level of Consciousness: awake, alert  and oriented  Airway & Oxygen Therapy: Patient Spontanous Breathing  Post-op Assessment: Report given to RN and Post -op Vital signs reviewed and stable  Post vital signs: Reviewed and stable  Last Vitals:  Vitals:   09/26/16 0932  BP: 97/61  Pulse: 70  Resp: 18  Temp: 37.2 C  SpO2: 100%    Last Pain:  Vitals:   09/26/16 0932  TempSrc: Oral  PainSc: 7       Patients Stated Pain Goal: 3 (74/14/23 9532)  Complications: No apparent anesthesia complications

## 2016-09-26 NOTE — Discharge Instructions (Addendum)
Dilation and Curettage or Vacuum Curettage, Care After This sheet gives you information about how to care for yourself after your procedure. Your health care provider may also give you more specific instructions. If you have problems or questions, contact your health care provider. What can I expect after the procedure? After your procedure, it is common to have:  Mild pain or cramping.  Some vaginal bleeding or spotting.  These may last for up to 2 weeks after your procedure. Follow these instructions at home: Activity   Do not drive or use heavy machinery while taking prescription pain medicine.  Avoid driving for the first 24 hours after your procedure.  Take frequent, short walks, followed by rest periods, throughout the day. Ask your health care provider what activities are safe for you. After 1-2 days, you may be able to return to your normal activities.  Do not lift anything heavier than 10 lb (4.5 kg) until your health care provider approves.  For at least 2 weeks, or as long as told by your health care provider, do not: ? Douche. ? Use tampons. ? Have sexual intercourse. General instructions   Take over-the-counter and prescription medicines only as told by your health care provider. This is especially important if you take blood thinning medicine.  Do not take baths, swim, or use a hot tub until your health care provider approves. Take showers instead of baths.  Wear compression stockings as told by your health care provider. These stockings help to prevent blood clots and reduce swelling in your legs.  It is your responsibility to get the results of your procedure. Ask your health care provider, or the department performing the procedure, when your results will be ready.  Keep all follow-up visits as told by your health care provider. This is important. Contact a health care provider if:  You have severe cramps that get worse or that do not get better with  medicine.  You have severe abdominal pain.  You cannot drink fluids without vomiting.  You develop pain in a different area of your pelvis.  You have bad-smelling vaginal discharge.  You have a rash. Get help right away if:  You have vaginal bleeding that soaks more than one sanitary pad in 1 hour, for 2 hours in a row.  You pass large blood clots from your vagina.  You have a fever that is above 100.6F (38.0C).  Your abdomen feels very tender or hard.  You have chest pain.  You have shortness of breath.  You cough up blood.  You feel dizzy or light-headed.  You faint.  You have pain in your neck or shoulder area. This information is not intended to replace advice given to you by your health care provider. Make sure you discuss any questions you have with your health care provider. Document Released: 01/04/2000 Document Revised: 09/05/2015 Document Reviewed: 08/09/2015 Elsevier Interactive Patient Education  2018 [employer].   Post Anesthesia Home Care Instructions  NO IBUPROFEN PRODUCTS UNTIL: 5:15 PM TODAY  Activity: Get plenty of rest for the remainder of the day. A responsible individual must stay with you for 24 hours following the procedure.  For the next 24 hours, DO NOT: -Drive a car -Advertising copywriter -Drink alcoholic beverages -Take any medication unless instructed by your physician -Make any legal decisions or sign important papers.  Meals: Start with liquid foods such as gelatin or soup. Progress to regular foods as tolerated. Avoid greasy, spicy, heavy foods. If nausea and/or vomiting  occur, drink only clear liquids until the nausea and/or vomiting subsides. Call your physician if vomiting continues.  Special Instructions/Symptoms: Your throat may feel dry or sore from the anesthesia or the breathing tube placed in your throat during surgery. If this causes discomfort, gargle with warm salt water. The discomfort should disappear within 24  hours.  If you had a scopolamine patch placed behind your ear for the management of post- operative nausea and/or vomiting:  1. The medication in the patch is effective for 72 hours, after which it should be removed.  Wrap patch in a tissue and discard in the trash. Wash hands thoroughly with soap and water. 2. You may remove the patch earlier than 72 hours if you experience unpleasant side effects which may include dry mouth, dizziness or visual disturbances. 3. Avoid touching the patch. Wash your hands with soap and water after contact with the patch.

## 2016-09-26 NOTE — Interval H&P Note (Signed)
History and Physical Interval Note:  09/26/2016 10:17 AM  Darlene Duncan  has presented today for surgery, with the diagnosis of Missed AB  The various methods of treatment have been discussed with the patient and family. After consideration of risks, benefits and other options for treatment, the patient has consented to  Procedure(s): DILATATION AND EVACUATION (N/A) as a surgical intervention .  The patient's history has been reviewed, patient examined, no change in status, stable for surgery.  I have reviewed the patient's chart and labs.  Questions were answered to the patient's satisfaction.     Hermina StaggersMichael L Sullivan Jacuinde

## 2016-09-26 NOTE — Anesthesia Postprocedure Evaluation (Signed)
Anesthesia Post Note  Patient: Darlene Duncan  Procedure(s) Performed: Procedure(s) (LRB): DILATATION AND EVACUATION FOR MISSED AB (N/A)     Patient location during evaluation: PACU Anesthesia Type: MAC Level of consciousness: awake and alert, oriented and patient cooperative Pain management: pain level controlled Vital Signs Assessment: post-procedure vital signs reviewed and stable Respiratory status: spontaneous breathing, nonlabored ventilation and respiratory function stable Cardiovascular status: blood pressure returned to baseline and stable Postop Assessment: no signs of nausea or vomiting Anesthetic complications: no    Last Vitals:  Vitals:   09/26/16 1245 09/26/16 1335  BP: (!) 98/59 (!) 95/37  Pulse: (!) 46 (!) 48  Resp: 10 16  Temp: 37 C 36.8 C  SpO2: 100% 100%    Last Pain:  Vitals:   09/26/16 1335  TempSrc:   PainSc: 0-No pain   Pain Goal: Patients Stated Pain Goal: 3 (09/26/16 1245)               Jupiter Kabir,E. Verline Kong

## 2016-09-26 NOTE — Anesthesia Preprocedure Evaluation (Addendum)
Anesthesia Evaluation  Patient identified by MRN, date of birth, ID band Patient awake    Reviewed: Allergy & Precautions, NPO status , Patient's Chart, lab work & pertinent test results  History of Anesthesia Complications Negative for: history of anesthetic complications  Airway Mallampati: I  TM Distance: >3 FB Neck ROM: Full    Dental  (+) Dental Advisory Given, Chipped, Implants   Pulmonary neg pulmonary ROS,    breath sounds clear to auscultation       Cardiovascular negative cardio ROS   Rhythm:Regular Rate:Normal     Neuro/Psych  Headaches (toothache),    GI/Hepatic negative GI ROS, Neg liver ROS,   Endo/Other  negative endocrine ROS  Renal/GU negative Renal ROS     Musculoskeletal   Abdominal   Peds  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[place], level mildly deficient as per patient, did not require treatment or referral to hematology   Anesthesia Other Findings   Reproductive/Obstetrics (+) Pregnancy (missed Ab)                            Anesthesia Physical Anesthesia Plan  ASA: II  Anesthesia Plan: MAC   Post-op Pain Management:    Induction: Intravenous  PONV Risk Score and Plan: 3 and Ondansetron, Dexamethasone and Midazolam  Airway Management Planned: Natural Airway  Additional Equipment:   Intra-op Plan:   Post-operative Plan:   Informed Consent: I have reviewed the patients History and Physical, chart, labs and discussed the procedure including the risks, benefits and alternatives for the proposed anesthesia with the patient or authorized representative who has indicated his/her understanding and acceptance.   Dental advisory given  Plan Discussed with: CRNA and Surgeon  Anesthesia Plan Comments: (Plan routine monitors, MAC)        Anesthesia Quick Evaluation

## 2016-09-27 ENCOUNTER — Encounter (HOSPITAL_COMMUNITY): Payer: Self-pay | Admitting: Obstetrics and Gynecology

## 2016-10-17 ENCOUNTER — Encounter: Payer: Self-pay | Admitting: Obstetrics and Gynecology

## 2016-10-17 ENCOUNTER — Telehealth: Payer: Self-pay | Admitting: Nurse Practitioner

## 2016-10-17 ENCOUNTER — Ambulatory Visit (INDEPENDENT_AMBULATORY_CARE_PROVIDER_SITE_OTHER): Payer: Medicaid Other | Admitting: Obstetrics and Gynecology

## 2016-10-17 ENCOUNTER — Encounter: Payer: Self-pay | Admitting: Nurse Practitioner

## 2016-10-17 VITALS — BP 107/65 | HR 55 | Wt 143.6 lb

## 2016-10-17 DIAGNOSIS — Z0000 Encounter for general adult medical examination without abnormal findings: Secondary | ICD-10-CM

## 2016-10-17 DIAGNOSIS — Z9889 Other specified postprocedural states: Secondary | ICD-10-CM | POA: Insufficient documentation

## 2016-10-17 DIAGNOSIS — D682 Hereditary deficiency of other clotting factors: Secondary | ICD-10-CM

## 2016-10-17 NOTE — Telephone Encounter (Signed)
Appt has been scheduled for the pt to see Santiago Glad, NP on 10/2 at [facility] from the referring office will notify the pt. Letter mailed.

## 2016-10-17 NOTE — Progress Notes (Signed)
Subjective:     Darlene Duncan is a 41 y.o. female who presents to the clinic 4 weeks status post D & C for missed AB. Eating a regular diet without difficulty. Bowel movements are normal. The patient is not having any pain.  The following portions of the patient's history were reviewed and updated as appropriate: allergies, current medications, past family history, past medical history, past social history and past surgical history.  Review of Systems Pertinent items are noted in HPI.    Objective:    BP 107/65   Pulse (!) 55   Wt 143 lb 9.6 oz (65.1 kg)   LMP 07/11/2016 (Exact Date) Comment: approx [redacted] wks gestation  Breastfeeding? No   BMI 23.18 kg/m  General:  alert  Abdomen: soft, bowel sounds active, non-tender  Incision:   NA     Assessment:    Doing well postoperatively. Operative findings again reviewed. Pathology report discussed.    Plan:    Return to normal activity. Condoms for contraception, last pap normal in 01/2016 Screening mammogram Refer to Heme/Onc for further eval of Factor 7 Def F/U PRN

## 2016-10-17 NOTE — Patient Instructions (Signed)

## 2016-10-21 ENCOUNTER — Ambulatory Visit (HOSPITAL_BASED_OUTPATIENT_CLINIC_OR_DEPARTMENT_OTHER): Payer: Medicaid Other

## 2016-10-21 ENCOUNTER — Encounter: Payer: Self-pay | Admitting: Nurse Practitioner

## 2016-10-21 ENCOUNTER — Ambulatory Visit (HOSPITAL_BASED_OUTPATIENT_CLINIC_OR_DEPARTMENT_OTHER): Payer: Medicaid Other | Admitting: Nurse Practitioner

## 2016-10-21 ENCOUNTER — Telehealth: Payer: Self-pay | Admitting: Nurse Practitioner

## 2016-10-21 VITALS — BP 103/53 | HR 63 | Temp 99.1°F | Resp 20 | Ht 66.0 in | Wt 144.0 lb

## 2016-10-21 DIAGNOSIS — D682 Hereditary deficiency of other clotting factors: Secondary | ICD-10-CM | POA: Diagnosis not present

## 2016-10-21 LAB — CBC WITH DIFFERENTIAL/PLATELET
BASO%: 0.7 % (ref 0.0–2.0)
BASOS ABS: 0 10*3/uL (ref 0.0–0.1)
EOS%: 7.7 % — AB (ref 0.0–7.0)
Eosinophils Absolute: 0.3 10*3/uL (ref 0.0–0.5)
HEMATOCRIT: 37.4 % (ref 34.8–46.6)
HEMOGLOBIN: 12.1 g/dL (ref 11.6–15.9)
LYMPH%: 38.3 % (ref 14.0–49.7)
MCH: 26.9 pg (ref 25.1–34.0)
MCHC: 32.4 g/dL (ref 31.5–36.0)
MCV: 83.3 fL (ref 79.5–101.0)
MONO#: 0.3 10*3/uL (ref 0.1–0.9)
MONO%: 7 % (ref 0.0–14.0)
NEUT%: 46.3 % (ref 38.4–76.8)
NEUTROS ABS: 2 10*3/uL (ref 1.5–6.5)
Platelets: 207 10*3/uL (ref 145–400)
RBC: 4.49 10*6/uL (ref 3.70–5.45)
RDW: 14.6 % — AB (ref 11.2–14.5)
WBC: 4.4 10*3/uL (ref 3.9–10.3)
lymph#: 1.7 10*3/uL (ref 0.9–3.3)

## 2016-10-21 NOTE — Progress Notes (Addendum)
[facility]  Telephone:(336) [phone] [phone]  Clinic New consult Note   Patient Care Team: Patient, No Pcp Per as PCP - General (General Practice) Hermina Staggers, MD as Consulting Physician (Obstetrics and Gynecology) 10/21/2016  REFERRAL PHYSICIAN: Dr. Alysia Penna  CHIEF COMPLAINTS/PURPOSE OF CONSULTATION:  Factor VII deficiency   HISTORY OF PRESENTING ILLNESS:  Darlene Duncan 41 y.o. 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 She is G5P2 with a history of 3 miscarriages, 2 of which were missed AB. In February 2018 while still living in [place], she had a missed AB with [place] confirmation of retained fetal tissue, she was then instructed to take cytotec. She did not take it because she felt this was actively aborting the baby but ultimately she had to undergo D&E which was complicated by heavy bleeding, this lead to factor VII deficiency finding, Factor VII activity 60%. The patient got pregnant again in June 2018 then relocated to [facility] where she underwent D&C for missed AB on 09/16/2016. She denies heavy bleeding after the procedure.   She was found to have abnormal CBC on 09/11/2016 showing mild anemia of 11.3; I do not have previous records for comparison. CBC on 09/26/16 was normal. She denies recent chest pain on exertion, shortness of breath on minimal exertion, pre-syncopal episodes, or palpitations.  She had not noticed any recent bleeding such as epistaxis, hematuria, hematochezia, or bleeding with minimally invasive procedures such as dental work. She has a history of superficial blood clot in her right foot that was evaluated by a vascular surgeon who determined it was non life threatening but was removed nonetheless. She has had no further thrombosis. She reports a 3-year period 15 years ago where she had heavy menses with blood clots, but this resolved after birth of her oldest child who is now 61. Her  menses are irregular but not heavy. She had no bleeding complications with her cholecystectomy or ovarian cyst removal surgeries; bleeding time was apparently normal at that time per her report. She has not received blood transfusion.   She uses over the counter NSAIDs for minor aches and pains. She is not on antiplatelets agents. She has not had a colonoscopy. She is having a mammogram done next week for routine screening. She had no prior history or diagnosis of cancer.  MEDICAL HISTORY:  Past Medical History:  Diagnosis Date  . Allergy    many food allergies  . Anemia   . Bradycardia    no meds  . Chronic kidney disease    cyst  . 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 Factor VII deficiency (HCC)   . Headache    otc med  . Miscarriage   . Thrombosis 2013   right foot, superficial; surgically removed    SURGICAL HISTORY: Past Surgical History:  Procedure Laterality Date  . CESAREAN SECTION     x 2  . CHOLECYSTECTOMY    . DILATION AND CURETTAGE OF UTERUS  02/2016   MAB  . DILATION AND EVACUATION N/A 09/26/2016   Procedure: DILATATION AND EVACUATION FOR MISSED AB;  Surgeon: Hermina Staggers, MD;  Location: [facility];  Service: Gynecology;  Laterality: N/A;  . OVARIAN CYST SURGERY    . UPPER GI ENDOSCOPY      SOCIAL HISTORY: Social History   Social History  . Marital status: Single    Spouse name: N/A  . Number of  children: N/A  . Years of education: N/A   Occupational History  . Not on file.   Social History Main Topics  . Smoking status: Never Smoker  . Smokeless tobacco: Never Used  . Alcohol use Yes     Comment: rarely  . Drug use: No  . Sexual activity: Yes    Birth control/ protection: None     Comment: approx [redacted] wks gestation   Other Topics Concern  . Not on file   Social History Narrative  . No narrative on file    FAMILY HISTORY: Family History  Problem Relation Age of Onset  . Benign prostatic hyperplasia Father   . Cancer  Maternal Grandmother        stomach cancer  No known history of blood or clotting disorder. She has 2 daughters, ages 52 and 32 who are healthy.  ALLERGIES:  has No Known Allergies.  MEDICATIONS:  Current Outpatient Prescriptions  Medication Sig Dispense Refill  . HYDROcodone-acetaminophen (NORCO/VICODIN) 5-325 MG tablet Take 1-2 tablets by mouth every 6 (six) hours as needed for moderate pain. (Patient not taking: Reported on 10/21/2016) 30 tablet 0  . ibuprofen (ADVIL,MOTRIN) 600 MG tablet Take 600 mg by mouth every 6 (six) hours as needed.     No current facility-administered medications for this visit.     REVIEW OF SYSTEMS:   Constitutional: Denies fevers, chills or abnormal night sweats Eyes: Denies blurriness of vision, double vision or watery eyes Ears, nose, mouth, throat, and face: Denies mucositis or sore throat Respiratory: Denies cough, dyspnea or wheezes Cardiovascular: Denies palpitation, chest discomfort or lower extremity swelling Gastrointestinal:  Denies nausea, vomiting, constipation, diarrhea, heartburn or change in bowel habits. Denies blood in stool. Skin: Denies abnormal skin rashes Lymphatics: Denies new lymphadenopathy, easy bruising, or bleeding. Neurological:Denies numbness, tingling or new weaknesses Behavioral/Psych: Mood is stable, no new changes  All other systems were reviewed with the patient and are negative.  PHYSICAL EXAMINATION: ECOG PERFORMANCE STATUS: 0 - Asymptomatic  Vitals:   10/21/16 0925  BP: (!) 103/53  Pulse: 63  Resp: 20  Temp: 99.1 F (37.3 C)  SpO2: 100%   Filed Weights   10/21/16 0925  Weight: 144 lb (65.3 kg)    GENERAL:alert, no distress and comfortable SKIN: skin color, texture, turgor are normal, no rashes or significant lesions EYES: normal, conjunctiva are pink and non-injected, sclera clear OROPHARYNX:no exudate, no erythema and lips, buccal mucosa, and tongue normal  NECK: supple, thyroid normal size,  non-tender, without nodularity LYMPH:  no palpable cervical, supraclavicular, axillary, or inguinal lymphadenopathy  LUNGS: clear to auscultation bilaterally,  normal breathing effort HEART: regular rate & rhythm, S1 and S2 present, no murmurs and no lower extremity edema ABDOMEN:abdomen soft, non-tender and normal bowel sounds, no palpable hepatosplenomegaly or masses. Musculoskeletal:no cyanosis of digits and no clubbing  PSYCH: alert & oriented x 3 with fluent speech NEURO: no focal motor/sensory deficits  LABORATORY DATA:  I have reviewed the data as listed CBC Latest Ref Rng & Units 10/21/2016 09/26/2016 09/11/2016  WBC 3.9 - 10.3 10e3/uL 4.4 4.9 3.9(L)  Hemoglobin 11.6 - 15.9 g/dL 16.1 09.6 11.3(L)  Hematocrit 34.8 - 46.6 % 37.4 36.3 33.5(L)  Platelets 145 - 400 10e3/uL 207 230 211    RADIOGRAPHIC STUDIES: I have personally reviewed the radiological images as listed and agreed with the findings in the report. No results found.  ASSESSMENT & PLAN: Darlene Duncan is a 41 y.o. Female with factor VII deficiency, pregnancy loss, superficial  thrombosis, and menorrhagia.   1. Factor VII deficiency? -I reviewed her medical record and lab results with the patient in detail.  -Factor VII activity level done in [place] is 60 in 04/09/2016 per Dr. Boyce Medici, which is below normal but not significantly low at the level of inheritable factor VII deficiency. The test was done after she had abortion which was complicated with heavy bleeding. I suspect there was possible component of consumption clotting factor deficiency after heavy bleeding.  - discussed clotting factor deficiencies are typically diagnosed at <50% of normal level, and commonly at <20% level.  Factor 8 inhibitor and Von Willebrand antigen were normal at that time. -I will repeat factor 7 activity today as well as CBC, PT, aPTT, and lupus anticoagulant panel  -If she is confirmed factor VII deficient, we discussed option of factor  VII replacement in heavy active bleeding episodes, she would not need prophylactic replacement. We discussed the risk of thrombosis with over replacement  -She does desire to get pregnant again, GYN instructed her to wait 3 months from [facility] on 09/16/16. I informed her she can f/u with [place] at that time or anytime thereafter if she has concerns.  -F/u is open at this point; we will f/u on labs drawn today.   2. Multiple miscarriage  -I recommend check lups anticoagulation test, to rule out APL syndrome, although she does not have history of DVT  -if lupus anticoagulant is positive, I will repeat this, and if positive a 2nd time, this indicates increased risk of thrombosis and she may need anticoagulation such as lovenox, especially during pregnancy  Plan -lab today and I will call her with the results -she will see [place] as needed in future   This was a shared visit with Dr. Mosetta Putt. All questions were answered. The patient knows to call the clinic with any problems, questions or concerns.  I spent 40 counseling the patient face to face. The total time spent in the appointment was 45 and more than 50% was on counseling.     Pollyann Samples, NP 10/21/16 1:48 PM   I have seen the patient, examined her. I agree with the assessment and and plan and have edited the notes.   Malachy Mood  10/21/2016

## 2016-10-21 NOTE — Telephone Encounter (Signed)
Scheduled lab appt per 10/2 - no additional appts scheduled gave patient AVS

## 2016-10-22 LAB — LUPUS ANTICOAGULANT PANEL
DRVVT: 34.9 s (ref 0.0–47.0)
PTT-LA: 35.7 s (ref 0.0–51.9)

## 2016-10-22 LAB — PROTHROMBIN TIME (PT)
INR: 1.2 (ref 0.8–1.2)
PROTHROMBIN TIME: 12 s (ref 9.1–12.0)

## 2016-10-22 LAB — APTT: aPTT: 31 s (ref 24–33)

## 2016-10-23 LAB — FACTOR 7 ASSAY: Factor VII Activity: 79 % (ref 51–186)

## 2016-10-24 ENCOUNTER — Telehealth: Payer: Self-pay | Admitting: Nurse Practitioner

## 2016-10-24 NOTE — Telephone Encounter (Signed)
I called to inform the patient her labs results are normal, she does not appear to have factor VII deficiency. It is likely she was tested in the setting of heavy bleeding which can result in low clotting factor due to consumption. She does not need to follow up here unless she develops bleeding and/or abnormal CBC in the future. She verbalizes understanding and thanks for the call.

## 2016-10-30 ENCOUNTER — Ambulatory Visit
Admission: RE | Admit: 2016-10-30 | Discharge: 2016-10-30 | Disposition: A | Discharge status: Home / Self Care | Payer: Medicaid Other | Source: Ambulatory Visit | Attending: Obstetrics and Gynecology | Admitting: Obstetrics and Gynecology

## 2016-10-30 DIAGNOSIS — Z0000 Encounter for general adult medical examination without abnormal findings: Secondary | ICD-10-CM

## 2017-02-18 ENCOUNTER — Encounter (HOSPITAL_COMMUNITY): Payer: Self-pay | Admitting: Emergency Medicine

## 2017-02-18 ENCOUNTER — Other Ambulatory Visit: Payer: Self-pay

## 2017-02-18 ENCOUNTER — Ambulatory Visit (HOSPITAL_COMMUNITY)
Admission: EM | Admit: 2017-02-18 | Discharge: 2017-02-18 | Disposition: A | Discharge status: Home / Self Care | Payer: Medicaid Other | Attending: Family Medicine | Admitting: Family Medicine

## 2017-02-18 DIAGNOSIS — R05 Cough: Secondary | ICD-10-CM | POA: Diagnosis not present

## 2017-02-18 DIAGNOSIS — R059 Cough, unspecified: Secondary | ICD-10-CM

## 2017-02-18 MED ORDER — BENZONATATE 100 MG PO CAPS: 100.0000 mg | ORAL_CAPSULE | Freq: Three times a day (TID) | ORAL | 0 refills | Status: DC

## 2017-02-18 MED ORDER — AZITHROMYCIN 250 MG PO TABS: 250.0000 mg | ORAL_TABLET | Freq: Every day | ORAL | 0 refills | Status: DC

## 2017-02-18 NOTE — ED Provider Notes (Signed)
[facility]   [id number] 02/18/17 Arrival Time: 1002  ASSESSMENT & PLAN:  1. Cough     Meds ordered this encounter  Medications  . azithromycin (ZITHROMAX) 250 MG tablet    Sig: Take 1 tablet (250 mg total) by mouth daily. Take first 2 tablets together, then 1 every day until finished.    Dispense:  6 tablet    Refill:  0  . benzonatate (TESSALON) 100 MG capsule    Sig: Take 1 capsule (100 mg total) by mouth every 8 (eight) hours.    Dispense:  21 capsule    Refill:  0   Will f/u in one week if not seeing improvement. OTC symptom care as needed. Ensure adequate fluid intake and rest. May f/u with PCP or here as needed.  Reviewed expectations re: course of current medical issues. Questions answered. Outlined signs and symptoms indicating need for more acute intervention. Patient verbalized understanding. After Visit Summary given.   SUBJECTIVE: History from: patient.  Darlene Duncan is a 42 y.o. female who presents with complaint of a persistent dry cough. Onset gradual, approximately 1 month ago. SOB: none. Wheezing: none. Fever: no. Overall normal PO intake without n/v. Sick contacts: no. OTC treatment: cough med without relief. Coughing so much her chest wall now hurts. No specific aggravating or alleviating factors reported. Cough worse at night.  Received flu shot this year: no.  Social History   Tobacco Use  Smoking Status Never Smoker  Smokeless Tobacco Never Used    ROS: As per HPI.   OBJECTIVE:  Vitals:   02/18/17 1033  BP: (!) 114/52  Pulse: 70  Resp: 20  Temp: 98.6 F (37 C)  TempSrc: Oral  SpO2: 100%     General appearance: alert; appears fatigued HEENT: nasal congestion; clear runny nose; throat irritation secondary to post-nasal drainage Neck: supple without LAD Lungs: unlabored respirations, symmetrical air entry; cough: mild, dry; no respiratory distress Skin: warm and dry Psychological: alert and cooperative; normal  mood and affect  No Known Allergies  Past Medical History:  Diagnosis Date  . Allergy    many food allergies  . Anemia   . Bradycardia    no meds  . Chronic kidney disease    cyst  . 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 Factor VII deficiency (HCC)   . Headache    otc med  . Miscarriage   . Thrombosis 2013   right foot, superficial; surgically removed   Family History  Problem Relation Age of Onset  . Benign prostatic hyperplasia Father   . Cancer Maternal Grandmother        stomach cancer   Social History   Socioeconomic History  . Marital status: Single    Spouse name: Not on file  . Number of children: Not on file  . Years of education: Not on file  . Highest education level: Not on file  Social Needs  . Financial resource strain: Not on file  . Food insecurity - worry: Not on file  . Food insecurity - inability: Not on file  . Transportation needs - medical: Not on file  . Transportation needs - non-medical: Not on file  Occupational History  . Not on file  Tobacco Use  . Smoking status: Never Smoker  . Smokeless tobacco: Never Used  Substance and Sexual Activity  . Alcohol use: Yes    Comment: rarely  . Drug use: No  . Sexual activity:  Yes    Birth control/protection: None    Comment: approx [redacted] wks gestation  Other Topics Concern  . Not on file  Social History Narrative  . Not on file           Mardella Layman, MD 02/18/17 [phone]

## 2017-02-18 NOTE — ED Triage Notes (Signed)
Onset of symptoms one month ago.  Reports a dry cough has lingered.  One week ago, symptoms worsened.  Cough with phlegm in chest, burning chest.  Pain in left neck.  Reports a "fire in chest" with coughing

## 2017-07-21 ENCOUNTER — Ambulatory Visit (HOSPITAL_COMMUNITY)
Admission: EM | Admit: 2017-07-21 | Discharge: 2017-07-21 | Disposition: A | Discharge status: Home / Self Care | Payer: Medicaid Other | Attending: Family Medicine | Admitting: Family Medicine

## 2017-07-21 ENCOUNTER — Encounter (HOSPITAL_COMMUNITY): Payer: Self-pay

## 2017-07-21 DIAGNOSIS — R42 Dizziness and giddiness: Secondary | ICD-10-CM | POA: Diagnosis not present

## 2017-07-21 DIAGNOSIS — G43909 Migraine, unspecified, not intractable, without status migrainosus: Secondary | ICD-10-CM | POA: Diagnosis not present

## 2017-07-21 DIAGNOSIS — R110 Nausea: Secondary | ICD-10-CM

## 2017-07-21 LAB — POCT I-STAT, CHEM 8
BUN: 13 mg/dL (ref 6–20)
CREATININE: 0.8 mg/dL (ref 0.44–1.00)
Calcium, Ion: 1.27 mmol/L (ref 1.15–1.40)
Chloride: 103 mmol/L (ref 98–111)
GLUCOSE: 96 mg/dL (ref 70–99)
HEMATOCRIT: 38 % (ref 36.0–46.0)
HEMOGLOBIN: 12.9 g/dL (ref 12.0–15.0)
Potassium: 4.4 mmol/L (ref 3.5–5.1)
Sodium: 140 mmol/L (ref 135–145)
TCO2: 24 mmol/L (ref 22–32)

## 2017-07-21 MED ORDER — DEXAMETHASONE SODIUM PHOSPHATE 10 MG/ML IJ SOLN
INTRAMUSCULAR | Status: AC
  Filled 2017-07-21: qty 1

## 2017-07-21 MED ORDER — METOCLOPRAMIDE HCL 5 MG/ML IJ SOLN
5.0000 mg | Freq: Once | INTRAMUSCULAR | Status: AC
Start: 2017-07-21 — End: 2017-07-21
  Administered 2017-07-21: 5 mg via INTRAMUSCULAR

## 2017-07-21 MED ORDER — KETOROLAC TROMETHAMINE 60 MG/2ML IM SOLN
60.0000 mg | Freq: Once | INTRAMUSCULAR | Status: AC
  Administered 2017-07-21: 60 mg via INTRAMUSCULAR

## 2017-07-21 MED ORDER — KETOROLAC TROMETHAMINE 60 MG/2ML IM SOLN
INTRAMUSCULAR | Status: AC
  Filled 2017-07-21: qty 2

## 2017-07-21 MED ORDER — METOCLOPRAMIDE HCL 5 MG/ML IJ SOLN
INTRAMUSCULAR | Status: AC
  Filled 2017-07-21: qty 2

## 2017-07-21 MED ORDER — DEXAMETHASONE SODIUM PHOSPHATE 10 MG/ML IJ SOLN
10.0000 mg | Freq: Once | INTRAMUSCULAR | Status: AC
  Administered 2017-07-21: 10 mg via INTRAMUSCULAR

## 2017-07-21 MED ORDER — NAPROXEN 500 MG PO TABS: 500.0000 mg | ORAL_TABLET | Freq: Two times a day (BID) | ORAL | 0 refills | Status: DC

## 2017-07-21 NOTE — Discharge Instructions (Signed)
We gave you a shot of Toradol, Reglan and Decadron today in clinic.  These should begin working in approximately 30 to 40 minutes.  Please use Naprosyn twice a day for further headache management at home.  Please follow-up if symptoms worsening, changing, developing worsening vision changes, dizziness or lightheadedness, headache not relieving.

## 2017-07-21 NOTE — ED Triage Notes (Signed)
Pt presents with an extreme headache with no relief. Pt also has blurry vision, dizziness and nausea.

## 2017-07-22 ENCOUNTER — Encounter (HOSPITAL_COMMUNITY): Payer: Self-pay

## 2017-07-22 ENCOUNTER — Other Ambulatory Visit: Payer: Self-pay

## 2017-07-22 ENCOUNTER — Emergency Department (HOSPITAL_COMMUNITY)
Admission: EM | Admit: 2017-07-22 | Discharge: 2017-07-22 | Disposition: A | Discharge status: Home / Self Care | Payer: Medicaid Other | Attending: Emergency Medicine | Admitting: Emergency Medicine

## 2017-07-22 DIAGNOSIS — R030 Elevated blood-pressure reading, without diagnosis of hypertension: Secondary | ICD-10-CM | POA: Insufficient documentation

## 2017-07-22 DIAGNOSIS — N189 Chronic kidney disease, unspecified: Secondary | ICD-10-CM | POA: Insufficient documentation

## 2017-07-22 DIAGNOSIS — H538 Other visual disturbances: Secondary | ICD-10-CM | POA: Diagnosis present

## 2017-07-22 NOTE — ED Notes (Signed)
PT states understanding of care given, follow up care. PT ambulated from ED to car with a steady gait.  

## 2017-07-22 NOTE — ED Notes (Signed)
PA at bedside.

## 2017-07-22 NOTE — ED Provider Notes (Signed)
[facility]    CSN: [id number] Arrival date & time: 07/21/17  1612     History   Chief Complaint Chief Complaint  Patient presents with  . Headache    HPI Darlene Duncan is a 42 y.o. female history of migraines, anemia, factor VII deficiency presenting today for evaluation of a headache.  Patient states that she has had a extreme headache to the posterior aspect of her left head.  Is been associated with some blurry vision when she moves her head as well as some nausea and lightheadedness.  She states this is different from her typical migraine that normally is located on one side of her face.  Described as pounding in nature.  Does have associated photophobia and phonophobia.  Headache is lasted for approximately 4 days.  She is used Tylenol yesterday without relief.  Denies lack of vision, blurriness will resolve after a couple seconds and refocusing eyes.  HPI  Past Medical History:  Diagnosis Date  . Allergy    many food allergies  . Anemia   . Bradycardia    no meds  . Chronic kidney disease    cyst  . 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 Factor VII deficiency (HCC)   . Headache    otc med  . Miscarriage   . Thrombosis 2013   right foot, superficial; surgically removed    Patient Active Problem List   Diagnosis Date Noted  . Post-operative state 10/17/2016  . Health care maintenance 10/17/2016  . 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 CESAREAN SECTION     x 2  . CHOLECYSTECTOMY    . DILATION AND CURETTAGE OF UTERUS  02/2016   MAB  . DILATION AND EVACUATION N/A 09/26/2016   Procedure: DILATATION AND EVACUATION FOR MISSED AB;  Surgeon: Hermina Staggers, MD;  Location: [facility];  Service: Gynecology;  Laterality: N/A;  . OVARIAN CYST SURGERY    . UPPER GI ENDOSCOPY      OB History    Gravida  5   Para  2   Term  2   Preterm  0   AB  2   Living  2     SAB   2   TAB  0   Ectopic  0   Multiple  0   Live Births  2            Home Medications    Prior to Admission medications   Medication Sig Start Date End Date Taking? Authorizing Provider  azithromycin (ZITHROMAX) 250 MG tablet Take 1 tablet (250 mg total) by mouth daily. Take first 2 tablets together, then 1 every day until finished. 02/18/17   Mardella Layman, MD  benzonatate (TESSALON) 100 MG capsule Take 1 capsule (100 mg total) by mouth every 8 (eight) hours. 02/18/17   Mardella Layman, MD  HYDROcodone-acetaminophen (NORCO/VICODIN) 5-325 MG tablet Take 1-2 tablets by mouth every 6 (six) hours as needed for moderate pain. Patient not taking: Reported on 10/21/2016 09/26/16   Hermina Staggers, MD  ibuprofen (ADVIL,MOTRIN) 600 MG tablet Take 600 mg by mouth every 6 (six) hours as needed.    [provider]  naproxen (NAPROSYN) 500 MG tablet Take 1 tablet (500 mg total) by mouth 2 (two) times daily. 07/21/17   Gurkaran Rahm, Junius Creamer, PA-C    Family History Family History  Problem Relation Age of  Onset  . Benign prostatic hyperplasia Father   . Cancer Maternal Grandmother        stomach cancer    Social History Social History   Tobacco Use  . Smoking status: Never Smoker  . Smokeless tobacco: Never Used  Substance Use Topics  . Alcohol use: Yes    Comment: rarely  . Drug use: No     Allergies   Patient has no known allergies.   Review of Systems Review of Systems  Constitutional: Negative for fatigue and fever.  HENT: Negative for congestion, sinus pressure and sore throat.   Eyes: Positive for photophobia and visual disturbance. Negative for pain.  Respiratory: Negative for cough and shortness of breath.   Cardiovascular: Negative for chest pain.  Gastrointestinal: Positive for nausea. Negative for abdominal pain and vomiting.  Genitourinary: Negative for decreased urine volume and hematuria.  Musculoskeletal: Positive for neck pain. Negative for myalgias and neck  stiffness.  Neurological: Positive for dizziness, light-headedness and headaches. Negative for syncope, facial asymmetry, speech difficulty, weakness and numbness.     Physical Exam Triage Vital Signs ED Triage Vitals  Enc Vitals Group     BP 07/21/17 1631 115/70     Pulse Rate 07/21/17 1631 61     Resp 07/21/17 1631 19     Temp 07/21/17 1631 99.1 F (37.3 C)     Temp Source 07/21/17 1631 Oral     SpO2 07/21/17 1631 97 %     Weight --      Height --      Head Circumference --      Peak Flow --      Pain Score 07/21/17 1629 5     Pain Loc --      Pain Edu? --      Excl. in GC? --    No data found.  Updated Vital Signs BP 115/70 (BP Location: Left Arm)   Pulse 61   Temp 99.1 F (37.3 C) (Oral)   Resp 19   LMP  (LMP Unknown)   SpO2 97%   Visual Acuity Right Eye Distance:   Left Eye Distance:   Bilateral Distance:    Right Eye Near:   Left Eye Near:    Bilateral Near:     Physical Exam  Constitutional: She is oriented to person, place, and time. She appears well-developed and well-nourished. No distress.  HENT:  Head: Normocephalic and atraumatic.  Mouth/Throat: Oropharynx is clear and moist.  Eyes: Pupils are equal, round, and reactive to light. Conjunctivae and EOM are normal.  Neck: Neck supple.  Cardiovascular: Normal rate and regular rhythm.  No murmur heard. Pulmonary/Chest: Effort normal and breath sounds normal. No respiratory distress.  Abdominal: Soft. There is no tenderness.  Musculoskeletal: She exhibits no edema.  Neurological: She is alert and oriented to person, place, and time.  Patient A&O x3, cranial nerves II-XII grossly intact, strength at shoulders, hips and knees 5/5, equal bilaterally, patellar reflex 2+ bilaterally.Negative Romberg and Pronator Drift. Gait without abnormality.   Skin: Skin is warm and dry.  Psychiatric: She has a normal mood and affect.  Nursing note and vitals reviewed.    UC Treatments / Results  Labs (all labs  ordered are listed, but only abnormal results are displayed) Labs Reviewed  POCT I-STAT, CHEM 8    EKG None  Radiology No results found.  Procedures Procedures (including critical care time)  Medications Ordered in UC Medications  ketorolac (TORADOL) injection 60 mg (60 mg  Intramuscular Given 07/21/17 1744)  metoCLOPramide (REGLAN) injection 5 mg (5 mg Intramuscular Given 07/21/17 1743)  dexamethasone (DECADRON) injection 10 mg (10 mg Intramuscular Given 07/21/17 1741)    Initial Impression / Assessment and Plan / UC Course  I have reviewed the triage vital signs and the nursing notes.  Pertinent labs & imaging results that were available during my care of the patient were reviewed by me and considered in my medical decision making (see chart for details).     Patient with headache, vital signs stable, no neuro deficit.  Is different than her typical headache.  Given no acute findings on exam we will go ahead and treat with Toradol, Reglan and Decadron.  Patient had kidney disease noted in her chart, i-STAT obtained prior to initiation of medicines to check kidney function.  Kidney function within normal limits.  Kidney seems to be related to cyst/stones. will send home with Naprosyn for further headache management.  Advised to go to emergency room if headache still persisting, worsening, changing, developing increased vision changes, lightheadedness or dizziness.Discussed strict return precautions. Patient verbalized understanding and is agreeable with plan.  Final Clinical Impressions(s) / UC Diagnoses   Final diagnoses:  Migraine without status migrainosus, not intractable, unspecified migraine type     Discharge Instructions     We gave you a shot of Toradol, Reglan and Decadron today in clinic.  These should begin working in approximately 30 to 40 minutes.  Please use Naprosyn twice a day for further headache management at home.  Please follow-up if symptoms worsening,  changing, developing worsening vision changes, dizziness or lightheadedness, headache not relieving.   ED Prescriptions    Medication Sig Dispense Auth. Provider   naproxen (NAPROSYN) 500 MG tablet Take 1 tablet (500 mg total) by mouth 2 (two) times daily. 30 tablet Shiesha Jahn, [place] C, PA-C     Controlled Substance Prescriptions [place] Controlled Substance Registry consulted? Not Applicable   Lew Dawes, [place] 07/22/17 1610

## 2017-07-22 NOTE — ED Provider Notes (Signed)
[facility] EMERGENCY DEPARTMENT Provider Note  CSN: [id number] Arrival date & time: 07/22/17  1759  History   Chief Complaint Chief Complaint  Patient presents with  . Blurred Vision    HPI Darlene Duncan is a 42 y.o. female with a medical history of factor VII deficiency, CKD and anemia who presented to the ED for elevated BP reading at home. Patient states that she was sitting at home when she felt her heart race. Heart racing prompted her to take her BP the initial reading was 112/70, the 2nd taken 20 min later was 174/86 and the last taken 10 min later was 184/102. Other than the palpitations which resolved, patient did not have any other symptoms. Denies chest pain, SOB, leg swelling, headache, vision changes, facial droop, slurred speech, paresthesias, AMS/confusion or difficulty with gait/balance/coordination.   Additional history obtained by medical chart. Patient seen at urgent care yesterday 07/21/17 for blurred vision and headache. She was treated with IM medications for migraine.   Past Medical History:  Diagnosis Date  . Allergy    many food allergies  . Anemia   . Bradycardia    no meds  . Chronic kidney disease    cyst  . 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 Factor VII deficiency (HCC)   . Headache    otc med  . Miscarriage   . Thrombosis 2013   right foot, superficial; surgically removed    Patient Active Problem List   Diagnosis Date Noted  . Post-operative state 10/17/2016  . Health care maintenance 10/17/2016  . 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 CESAREAN SECTION     x 2  . CHOLECYSTECTOMY    . DILATION AND CURETTAGE OF UTERUS  02/2016   MAB  . DILATION AND EVACUATION N/A 09/26/2016   Procedure: DILATATION AND EVACUATION FOR MISSED AB;  Surgeon: Hermina Staggers, MD;  Location: [facility];  Service: Gynecology;  Laterality: N/A;  . OVARIAN CYST  SURGERY    . UPPER GI ENDOSCOPY       OB History    Gravida  5   Para  2   Term  2   Preterm  0   AB  2   Living  2     SAB  2   TAB  0   Ectopic  0   Multiple  0   Live Births  2            Home Medications    Prior to Admission medications   Medication Sig Start Date End Date Taking? Authorizing Provider  azithromycin (ZITHROMAX) 250 MG tablet Take 1 tablet (250 mg total) by mouth daily. Take first 2 tablets together, then 1 every day until finished. 02/18/17   Mardella Layman, MD  benzonatate (TESSALON) 100 MG capsule Take 1 capsule (100 mg total) by mouth every 8 (eight) hours. 02/18/17   Mardella Layman, MD  HYDROcodone-acetaminophen (NORCO/VICODIN) 5-325 MG tablet Take 1-2 tablets by mouth every 6 (six) hours as needed for moderate pain. Patient not taking: Reported on 10/21/2016 09/26/16   Hermina Staggers, MD  ibuprofen (ADVIL,MOTRIN) 600 MG tablet Take 600 mg by mouth every 6 (six) hours as needed.    [provider]  naproxen (NAPROSYN) 500 MG tablet Take 1 tablet (500 mg total) by mouth 2 (two) times daily. 07/21/17   Wieters, Junius Creamer, PA-C  Family History Family History  Problem Relation Age of Onset  . Benign prostatic hyperplasia Father   . Cancer Maternal Grandmother        stomach cancer    Social History Social History   Tobacco Use  . Smoking status: Never Smoker  . Smokeless tobacco: Never Used  Substance Use Topics  . Alcohol use: Yes    Comment: rarely  . Drug use: No     Allergies   Patient has no known allergies.   Review of Systems Review of Systems  Constitutional: Negative.   Eyes: Negative for visual disturbance.  Respiratory: Negative for cough, chest tightness and shortness of breath.   Cardiovascular: Positive for palpitations. Negative for chest pain and leg swelling.  Neurological: Negative for dizziness, weakness, light-headedness, numbness and headaches.     Physical Exam Updated Vital Signs BP  112/62 (BP Location: Right Arm)   Pulse (!) 56   Temp 98.5 F (36.9 C) (Oral)   Resp 18   Ht 5\' 6"  (1.676 m)   Wt 68 kg (150 lb)   LMP 07/09/2017   SpO2 99%   BMI 24.21 kg/m   Physical Exam  Constitutional: Vital signs are normal. She appears well-developed and well-nourished. She is cooperative. She does not appear ill. No distress.  HENT:  Mouth/Throat: Uvula is midline, oropharynx is clear and moist and mucous membranes are normal.  Eyes: Pupils are equal, round, and reactive to light. Conjunctivae, EOM and lids are normal.  Cardiovascular: Normal rate, regular rhythm, normal heart sounds, intact distal pulses and normal pulses.  Pulmonary/Chest: Effort normal and breath sounds normal.  Neurological: She is alert. She has normal strength and normal reflexes. No cranial nerve deficit or sensory deficit. She exhibits normal muscle tone.  Nursing note and vitals reviewed.    ED Treatments / Results  Labs (all labs ordered are listed, but only abnormal results are displayed) Labs Reviewed - No data to display  EKG None  Radiology No results found.  Procedures Procedures (including critical care time)  Medications Ordered in ED Medications - No data to display   Initial Impression / Assessment and Plan / ED Course  Triage vital signs and the nursing notes have been reviewed.  Pertinent labs & imaging results that were available during care of the patient were reviewed and considered in medical decision making (see chart for details).  Clinical Course as of Jul 22 1948  Wed Jul 22, 2017  1850 EKG showed NSR with bradycardia. No T wave abnormalities, ST elevations/depressions or signs of acute ischemia or infarct.   [GM]    Clinical Course User Index [GM] Windy Carina, [place]   Patient presents with normal vital signs and a BP of 112/55. She is well appearing, in no acute distress and is currently asymptomatic. Patient denies s/s that suggest an acute cardiac,  pulmonary or neuro process. Physical exam is normal and has no focal neuro deficits.  Given that patient's BP steadily increased with readings taken at home, it is possible that anxiety had a role to play in patient's elevated BP. Per medial chart review, no history of HTN and not antihypertensives. No signs or history that is consistent with end organ damage that warrants further evaluation today.  Final Clinical Impressions(s) / ED Diagnoses  1. Elevated Blood Pressure. Normal vitals in ED and asymptomatic.  Education provided on HTN and appropriate diagnostic criteria. Advised patient to follow-up with PCP if she continues to have concerns about blood pressure  readings. Educated on appropriate and emergent s/s that would warrant return to the ED.  Dispo: Home. After thorough clinical evaluation, this patient is determined to be medically stable and can be safely discharged with the previously mentioned treatment and/or outpatient follow-up/referral(s). At this time, there are no other apparent medical conditions that require further screening, evaluation or treatment.   Final diagnoses:  Elevated blood pressure reading    ED Discharge Orders    None        Windy CarinaMortis, Maverick Patman I, [place]-C 07/22/17 1950    Little, Ambrose Finlandachel Morgan, MD 07/24/17 [phone]

## 2017-07-22 NOTE — ED Triage Notes (Signed)
Pt states that she was sitting today and started to have blurred vision. Pt [facility]. Vision is no longer blurred. Took BP at home and got reading of 135 then 185. VSS in triage.

## 2017-07-22 NOTE — Discharge Instructions (Addendum)
You had normal blood pressure during your visit with us at 112/55. EKG was normal today too.   I have placed contact information below for one of our primary care clinics. It is important that you go to this clinic or call the number highlighted in your discharge paperwork so you can pick a primary care doctor who can follow-up with you for headaches, elevated blood pressures and other non-emergent health concerns.

## 2017-07-27 ENCOUNTER — Ambulatory Visit (INDEPENDENT_AMBULATORY_CARE_PROVIDER_SITE_OTHER): Payer: Medicaid Other | Admitting: Obstetrics & Gynecology

## 2017-07-27 ENCOUNTER — Encounter: Payer: Self-pay | Admitting: Obstetrics & Gynecology

## 2017-07-27 ENCOUNTER — Other Ambulatory Visit (HOSPITAL_COMMUNITY)
Admission: RE | Admit: 2017-07-27 | Discharge: 2017-07-27 | Disposition: A | Discharge status: Home / Self Care | Payer: Medicaid Other | Source: Ambulatory Visit | Attending: Obstetrics & Gynecology | Admitting: Obstetrics & Gynecology

## 2017-07-27 VITALS — BP 111/65 | HR 68 | Wt 155.0 lb

## 2017-07-27 DIAGNOSIS — R030 Elevated blood-pressure reading, without diagnosis of hypertension: Secondary | ICD-10-CM

## 2017-07-27 DIAGNOSIS — R102 Pelvic and perineal pain: Secondary | ICD-10-CM | POA: Diagnosis present

## 2017-07-27 DIAGNOSIS — Z0000 Encounter for general adult medical examination without abnormal findings: Secondary | ICD-10-CM | POA: Insufficient documentation

## 2017-07-27 NOTE — Progress Notes (Signed)
   Subjective:    Patient ID: Darlene SartoriusAracelis de La Duncan, female    DOB: 10/30/1975, 42 y.o.   MRN: [id number]  HPI 42 yo single P2 36(13 and 42 yo girls) here with a change in her periods. They last for 4 days and most of it is dark bleeding. She is also having pain with and without her periods. She has not tried anything to make it better. It is uncomfortable with sex for about 3 years. Sometimes this is positional. She had a d&c (twice) for a miscarriages in the past. She does not use contraception.    Review of Systems She has been monogamous for 5 years.     Objective:   Physical Exam Breathing, conversing, and ambulating normally Well nourished, well hydrated Latina, no apparent distress  Nulliparous cervix normal size and shape, retroverted, mobile, non-tender, normal adnexal exam     Assessment & Plan:  Dark brown blood with desire for pregnancy- reassurance given Continue MVI daily Pelvic pain- schedule gyn u/s

## 2017-07-30 LAB — CYTOLOGY - PAP
Adequacy: ABSENT
CHLAMYDIA, DNA PROBE: NEGATIVE
DIAGNOSIS: NEGATIVE
HPV: NOT DETECTED
NEISSERIA GONORRHEA: NEGATIVE

## 2017-07-31 ENCOUNTER — Ambulatory Visit (HOSPITAL_COMMUNITY)
Admission: RE | Admit: 2017-07-31 | Discharge: 2017-07-31 | Disposition: A | Discharge status: Home / Self Care | Payer: Medicaid Other | Source: Ambulatory Visit | Attending: Obstetrics & Gynecology | Admitting: Obstetrics & Gynecology

## 2017-07-31 DIAGNOSIS — R9389 Abnormal findings on diagnostic imaging of other specified body structures: Secondary | ICD-10-CM | POA: Diagnosis not present

## 2017-07-31 DIAGNOSIS — R102 Pelvic and perineal pain: Secondary | ICD-10-CM | POA: Diagnosis not present

## 2017-08-13 ENCOUNTER — Other Ambulatory Visit: Payer: Self-pay | Admitting: Obstetrics & Gynecology

## 2017-08-13 DIAGNOSIS — N83201 Unspecified ovarian cyst, right side: Secondary | ICD-10-CM

## 2017-08-13 NOTE — Progress Notes (Unsigned)
Ultrasound ordered to follow up right ovarian cyst

## 2017-08-14 ENCOUNTER — Telehealth: Payer: Self-pay

## 2017-08-14 NOTE — Telephone Encounter (Signed)
-----   Message from Allie BossierMyra C Dove, MD sent at 08/13/2017  9:26 AM EDT ----- Please schedule this u/s to follow up right ovarian cyst. Please let patient know. Thanks

## 2017-08-14 NOTE — Telephone Encounter (Signed)
US scheduled August 8th @ 0930.  Notified pt with Spanish Interpreter Darlene Duncan of appt.  Pt did not know why she was having the US.  I explained to the pt that it is to make sure that the ovarian cyst has resolved.  Pt asked does she need to make an appt after her US.  I advised pt that we contact her with f/u after her US.  Pt stated understanding with no further questions.

## 2017-08-25 ENCOUNTER — Ambulatory Visit (HOSPITAL_COMMUNITY)
Admission: RE | Admit: 2017-08-25 | Discharge: 2017-08-25 | Disposition: A | Discharge status: Home / Self Care | Payer: Medicaid Other | Source: Ambulatory Visit | Attending: Obstetrics & Gynecology | Admitting: Obstetrics & Gynecology

## 2017-08-25 DIAGNOSIS — N83201 Unspecified ovarian cyst, right side: Secondary | ICD-10-CM | POA: Diagnosis not present

## 2017-09-17 ENCOUNTER — Ambulatory Visit (INDEPENDENT_AMBULATORY_CARE_PROVIDER_SITE_OTHER): Payer: Medicaid Other | Admitting: General Practice

## 2017-09-17 ENCOUNTER — Encounter: Payer: Self-pay | Admitting: Family Medicine

## 2017-09-17 DIAGNOSIS — Z3201 Encounter for pregnancy test, result positive: Secondary | ICD-10-CM | POA: Diagnosis not present

## 2017-09-17 NOTE — Progress Notes (Signed)
Patient presents to office today for UPT. UPT +. Patient reports first positive home test 8/16. LMP 08/06/17 EDD 05/13/18 6w today. Patient reports taking only PNV. Recommended she continue PNV and start OB care. Patient verbalized understanding & had no questions.

## 2017-09-18 LAB — POCT PREGNANCY, URINE: Preg Test, Ur: POSITIVE — AB

## 2017-09-18 NOTE — Progress Notes (Signed)
Agree with nurses's documentation of this patient's clinic encounter.  Mykah Shin L, MD  

## 2017-09-21 ENCOUNTER — Inpatient Hospital Stay (HOSPITAL_COMMUNITY)
Admission: AD | Admit: 2017-09-21 | Discharge: 2017-09-21 | Disposition: A | Discharge status: Home / Self Care | Payer: Medicaid Other | Source: Ambulatory Visit | Attending: Obstetrics and Gynecology | Admitting: Obstetrics and Gynecology

## 2017-09-21 ENCOUNTER — Inpatient Hospital Stay (HOSPITAL_COMMUNITY): Payer: Medicaid Other

## 2017-09-21 ENCOUNTER — Other Ambulatory Visit: Payer: Self-pay

## 2017-09-21 ENCOUNTER — Encounter (HOSPITAL_COMMUNITY): Payer: Self-pay | Admitting: *Deleted

## 2017-09-21 DIAGNOSIS — O219 Vomiting of pregnancy, unspecified: Secondary | ICD-10-CM

## 2017-09-21 DIAGNOSIS — N8311 Corpus luteum cyst of right ovary: Secondary | ICD-10-CM | POA: Insufficient documentation

## 2017-09-21 DIAGNOSIS — O209 Hemorrhage in early pregnancy, unspecified: Secondary | ICD-10-CM | POA: Diagnosis not present

## 2017-09-21 DIAGNOSIS — O26891 Other specified pregnancy related conditions, first trimester: Secondary | ICD-10-CM | POA: Diagnosis not present

## 2017-09-21 DIAGNOSIS — O3481 Maternal care for other abnormalities of pelvic organs, first trimester: Secondary | ICD-10-CM | POA: Insufficient documentation

## 2017-09-21 DIAGNOSIS — N189 Chronic kidney disease, unspecified: Secondary | ICD-10-CM | POA: Diagnosis not present

## 2017-09-21 DIAGNOSIS — R109 Unspecified abdominal pain: Secondary | ICD-10-CM | POA: Insufficient documentation

## 2017-09-21 DIAGNOSIS — N898 Other specified noninflammatory disorders of vagina: Secondary | ICD-10-CM

## 2017-09-21 DIAGNOSIS — O218 Other vomiting complicating pregnancy: Secondary | ICD-10-CM | POA: Insufficient documentation

## 2017-09-21 DIAGNOSIS — Z3A01 Less than 8 weeks gestation of pregnancy: Secondary | ICD-10-CM | POA: Insufficient documentation

## 2017-09-21 DIAGNOSIS — Z3491 Encounter for supervision of normal pregnancy, unspecified, first trimester: Secondary | ICD-10-CM

## 2017-09-21 DIAGNOSIS — O26831 Pregnancy related renal disease, first trimester: Secondary | ICD-10-CM | POA: Insufficient documentation

## 2017-09-21 LAB — CBC
HEMATOCRIT: 33.7 % — AB (ref 36.0–46.0)
Hemoglobin: 11.3 g/dL — ABNORMAL LOW (ref 12.0–15.0)
MCH: 27.3 pg (ref 26.0–34.0)
MCHC: 33.5 g/dL (ref 30.0–36.0)
MCV: 81.4 fL (ref 78.0–100.0)
PLATELETS: 225 10*3/uL (ref 150–400)
RBC: 4.14 MIL/uL (ref 3.87–5.11)
RDW: 14 % (ref 11.5–15.5)
WBC: 5.7 10*3/uL (ref 4.0–10.5)

## 2017-09-21 LAB — WET PREP, GENITAL
Clue Cells Wet Prep HPF POC: NONE SEEN
Sperm: NONE SEEN
TRICH WET PREP: NONE SEEN
Yeast Wet Prep HPF POC: NONE SEEN

## 2017-09-21 LAB — URINALYSIS, ROUTINE W REFLEX MICROSCOPIC
BILIRUBIN URINE: NEGATIVE
GLUCOSE, UA: NEGATIVE mg/dL
HGB URINE DIPSTICK: NEGATIVE
KETONES UR: NEGATIVE mg/dL
Leukocytes, UA: NEGATIVE
Nitrite: NEGATIVE
PROTEIN: NEGATIVE mg/dL
Specific Gravity, Urine: 1.03 (ref 1.005–1.030)
pH: 5 (ref 5.0–8.0)

## 2017-09-21 LAB — HCG, QUANTITATIVE, PREGNANCY: hCG, Beta Chain, Quant, S: 74264 m[IU]/mL — ABNORMAL HIGH (ref <5)

## 2017-09-21 MED ORDER — METOCLOPRAMIDE HCL 10 MG PO TABS
10.0000 mg | ORAL_TABLET | Freq: Three times a day (TID) | ORAL | 0 refills | Status: AC | PRN
Start: 2017-09-21 — End: ?

## 2017-09-21 MED ORDER — RANITIDINE HCL 150 MG PO TABS: 150.0000 mg | ORAL_TABLET | Freq: Two times a day (BID) | ORAL | 0 refills | Status: DC

## 2017-09-21 NOTE — Discharge Instructions (Signed)
[place] [facility] ° ° °[facility]       Phone: [phone] ° °[facility] Phone: [phone] ° °[facility] [place]  Phone: [phone] ° °[facility] [place]  Phone: [phone] ° °[facility] [place]  Phone: [phone] ° °[facility]       Phone: [phone] ° °[facility]    Phone: [phone]  ° °[facility]    Phone: [phone] ° °[facility]    Phone: [phone] ° °[facility]    Phone: [phone]  ° °[facility]  Phone: [phone] ° °[facility]    Phone: [phone] ° °[facility]    Phone: [phone] ° °[facility]    Phone: [phone] ° ° ° ° °First Trimester of Pregnancy °The first trimester of pregnancy is from week 1 until the end of week 13 (months 1 through 3). During this time, your baby will begin to develop inside you. At 6-8 weeks, the eyes and face are formed, and the heartbeat can be seen on ultrasound. At the end of 12 weeks, all the baby's organs are formed. Prenatal care is all the medical care you receive before the birth of your baby. Make sure you get good prenatal care and follow all of your doctor's instructions. °Follow these instructions at home: °Medicines °· Take over-the-counter and prescription medicines only as told by your doctor. Some medicines are safe and some medicines are not safe during pregnancy. °· Take a prenatal vitamin that contains at least 600 micrograms (mcg) of folic acid. °· If you have trouble pooping (constipation), take medicine that will make your stool soft (stool softener) if your doctor approves. °Eating and drinking °· Eat regular, healthy meals. °· Your doctor will tell you  the amount of weight gain that is right for you. °· Avoid raw meat and uncooked cheese. °· If you feel sick to your stomach (nauseous) or throw up (vomit): °? Eat 4 or 5 small meals a day instead of 3 large meals. °? Try eating a few soda crackers. °? Drink liquids between meals instead of during meals. °· To prevent constipation: °? Eat foods that are high in fiber, like fresh fruits and vegetables, whole grains, and beans. °? Drink enough fluids to keep your pee (urine) clear or pale yellow. °Activity °· Exercise only as told by your doctor. Stop exercising if you have cramps or pain in your lower belly (abdomen) or low back. °· Do not exercise if it is too hot, too humid, or if you are in a place of great height (high altitude). °· Try to avoid standing for long periods of time. Move your legs often if you must stand in one place for a long time. °· Avoid heavy lifting. °· Wear low-heeled shoes. Sit and stand up straight. °· You can have sex unless your doctor tells you not to. °Relieving pain and discomfort °· Wear a good support bra if your breasts are sore. °· Take warm water baths (sitz baths) to soothe pain or discomfort caused by hemorrhoids. Use hemorrhoid cream if your doctor says it is okay. °· Rest with your legs raised if you have leg cramps or low back pain. °· If you have puffy, bulging veins (varicose veins) in your legs: °? Wear support hose or compression stockings as   told by your doctor. °? Raise (elevate) your feet for 15 minutes, 3-4 times a day. °? Limit salt in your food. °Prenatal care °· Schedule your prenatal visits by the twelfth week of pregnancy. °· Write down your questions. Take them to your prenatal visits. °· Keep all your prenatal visits as told by your doctor. This is important. °Safety °· Wear your seat belt at all times when driving. °· Make a list of emergency phone numbers. The list should include numbers for family, friends, the hospital, and police and fire  departments. °General instructions °· Ask your doctor for a referral to a local prenatal class. Begin classes no later than at the start of month 6 of your pregnancy. °· Ask for help if you need counseling or if you need help with nutrition. Your doctor can give you advice or tell you where to go for help. °· Do not use hot tubs, steam rooms, or saunas. °· Do not douche or use tampons or scented sanitary pads. °· Do not cross your legs for long periods of time. °· Avoid all herbs and alcohol. Avoid drugs that are not approved by your doctor. °· Do not use any tobacco products, including cigarettes, chewing tobacco, and electronic cigarettes. If you need help quitting, ask your doctor. You may get counseling or other support to help you quit. °· Avoid cat litter boxes and soil used by cats. These carry germs that can cause birth defects in the baby and can cause a loss of your baby (miscarriage) or stillbirth. °· Visit your dentist. At home, brush your teeth with a soft toothbrush. Be gentle when you floss. °Contact a doctor if: °· You are dizzy. °· You have mild cramps or pressure in your lower belly. °· You have a nagging pain in your belly area. °· You continue to feel sick to your stomach, you throw up, or you have watery poop (diarrhea). °· You have a bad smelling fluid coming from your vagina. °· You have pain when you pee (urinate). °· You have increased puffiness (swelling) in your face, hands, legs, or ankles. °Get help right away if: °· You have a fever. °· You are leaking fluid from your vagina. °· You have spotting or bleeding from your vagina. °· You have very bad belly cramping or pain. °· You gain or lose weight rapidly. °· You throw up blood. It may look like coffee grounds. °· You are around people who have German measles, fifth disease, or chickenpox. °· You have a very bad headache. °· You have shortness of breath. °· You have any kind of trauma, such as from a fall or a car  accident. °Summary °· The first trimester of pregnancy is from week 1 until the end of week 13 (months 1 through 3). °· To take care of yourself and your unborn baby, you will need to eat healthy meals, take medicines only if your doctor tells you to do so, and do activities that are safe for you and your baby. °· Keep all follow-up visits as told by your doctor. This is important as your doctor will have to ensure that your baby is healthy and growing well. °This information is not intended to replace advice given to you by your health care provider. Make sure you discuss any questions you have with your health care provider. °Document Released: 06/25/2007 Document Revised: 01/15/2016 Document Reviewed: 01/15/2016 °Elsevier Interactive Patient Education © 2017 [employer] ° °

## 2017-09-21 NOTE — MAU Provider Note (Signed)
Chief Complaint: Abdominal Pain; Vaginal Bleeding; Vaginal Discharge; and Dizziness   First Provider Initiated Contact with Patient 09/21/17 1331     SUBJECTIVE HPI: Darlene Duncan is a 42 y.o. [id number] at [redacted]w[redacted]d who presents to Maternity Admissions reporting vaginal discharge, nausea, & abdominal pain. Concerned d/t hx of miscarriages.  Has noticed brown discharge when she wipes x 2 days. Denies vaginal bleeding or irritation. No recent intercourse.  Reports pain in her lower abdomen. Pain is quick & sharp. Only lasts for a second at a time. Occurs 2-3 times per day. Currently no pain.  Has had nausea with pregnancy. Has daily vomiting. Also endorses foul taste in back of throat that's not improved with brushing her teeth, mints, and mouthwash.   Past Medical History:  Diagnosis Date  . Allergy    many food allergies  . Anemia   . Bradycardia    no meds  . Chronic kidney disease    cyst  . Headache    otc med  . Miscarriage   . Thrombosis 2013   right foot, superficial; surgically removed   OB History  Gravida Para Term Preterm AB Living  6 2 2  0 3 2  SAB TAB Ectopic Multiple Live Births  3 0 0 0 2    # Outcome Date GA Lbr Len/2nd Weight Sex Delivery Anes PTL Lv  6 Current           5 SAB           4 SAB           3 SAB           2 Term      CS-LTranv     1 Term      CS-LTranv      Past Surgical History:  Procedure Laterality Date  . CESAREAN SECTION     x 2  . CHOLECYSTECTOMY    . DILATION AND CURETTAGE OF UTERUS  02/2016   MAB  . DILATION AND EVACUATION N/A 09/26/2016   Procedure: DILATATION AND EVACUATION FOR MISSED AB;  Surgeon: Hermina Staggers, MD;  Location: [facility];  Service: Gynecology;  Laterality: N/A;  . OVARIAN CYST SURGERY    . UPPER GI ENDOSCOPY     Social History   Socioeconomic History  . Marital status: Single    Spouse name: Not on file  . Number of children: Not on file  . Years of education: Not on file  . Highest education level: Not on  file  Occupational History  . Not on file  Social Needs  . Financial resource strain: Not on file  . Food insecurity:    Worry: Not on file    Inability: Not on file  . Transportation needs:    Medical: Not on file    Non-medical: Not on file  Tobacco Use  . Smoking status: Never Smoker  . Smokeless tobacco: Never Used  Substance and Sexual Activity  . Alcohol use: Yes    Comment: rarely  . Drug use: No  . Sexual activity: Yes    Birth control/protection: None    Comment: approx [redacted] wks gestation  Lifestyle  . Physical activity:    Days per week: Not on file    Minutes per session: Not on file  . Stress: Not on file  Relationships  . Social connections:    Talks on phone: Not on file    Gets together: Not on file  Attends religious service: Not on file    Active member of club or organization: Not on file    Attends meetings of clubs or organizations: Not on file    Relationship status: Not on file  . Intimate partner violence:    Fear of current or ex partner: Not on file    Emotionally abused: Not on file    Physically abused: Not on file    Forced sexual activity: Not on file  Other Topics Concern  . Not on file  Social History Narrative  . Not on file   Family History  Problem Relation Age of Onset  . Benign prostatic hyperplasia Father   . Cancer Maternal Grandmother        stomach cancer   No current facility-administered medications on file prior to encounter.    No current outpatient medications on file prior to encounter.   No Known Allergies  I have reviewed patient's Past Medical Hx, Surgical Hx, Family Hx, Social Hx, medications and allergies.   Review of Systems  Constitutional: Negative.   Gastrointestinal: Positive for abdominal pain, nausea and vomiting. Negative for constipation and diarrhea.  Genitourinary: Positive for vaginal discharge. Negative for dysuria and vaginal bleeding.    OBJECTIVE Patient Vitals for the past 24 hrs:  BP  Temp Temp src Pulse Resp SpO2 Weight  09/21/17 1432 (!) 99/54 - - (!) 53 18 - -  09/21/17 1124 (!) 102/57 98.1 F (36.7 C) Oral 73 17 99 % 67.7 kg   Constitutional: Well-developed, well-nourished female in no acute distress.  Cardiovascular: normal rate & rhythm, no murmur Respiratory: normal rate and effort. Lung sounds clear throughout GI: Abd soft, non-tender, Pos BS x 4. No guarding or rebound tenderness MS: Extremities nontender, no edema, normal ROM Neurologic: Alert and oriented x 4.  GU:     SPECULUM EXAM: NEFG, minimal amount of thin tan discharge, no blood  noted, cervix clean  BIMANUAL: No CMT. cervix closed; uterus normal size, no adnexal  tenderness or masses.    LAB RESULTS Results for orders placed or performed during the hospital encounter of 09/21/17 (from the past 24 hour(s))  Urinalysis, Routine w reflex microscopic     Status: Abnormal   Collection Time: 09/21/17 11:36 AM  Result Value Ref Range   Color, Urine YELLOW YELLOW   APPearance HAZY (A) CLEAR   Specific Gravity, Urine 1.030 1.005 - 1.030   pH 5.0 5.0 - 8.0   Glucose, UA NEGATIVE NEGATIVE mg/dL   Hgb urine dipstick NEGATIVE NEGATIVE   Bilirubin Urine NEGATIVE NEGATIVE   Ketones, ur NEGATIVE NEGATIVE mg/dL   Protein, ur NEGATIVE NEGATIVE mg/dL   Nitrite NEGATIVE NEGATIVE   Leukocytes, UA NEGATIVE NEGATIVE  CBC     Status: Abnormal   Collection Time: 09/21/17 12:51 PM  Result Value Ref Range   WBC 5.7 4.0 - 10.5 K/uL   RBC 4.14 3.87 - 5.11 MIL/uL   Hemoglobin 11.3 (L) 12.0 - 15.0 g/dL   HCT 35.3 (L) 29.9 - 24.2 %   MCV 81.4 78.0 - 100.0 fL   MCH 27.3 26.0 - 34.0 pg   MCHC 33.5 30.0 - 36.0 g/dL   RDW 68.3 41.9 - 62.2 %   Platelets 225 150 - 400 K/uL  hCG, quantitative, pregnancy     Status: Abnormal   Collection Time: 09/21/17 12:51 PM  Result Value Ref Range   hCG, Beta Chain, Quant, S 74,264 (H) <5 mIU/mL  Wet prep, genital  Status: Abnormal   Collection Time: 09/21/17  2:02 PM  Result  Value Ref Range   Yeast Wet Prep HPF POC NONE SEEN NONE SEEN   Trich, Wet Prep NONE SEEN NONE SEEN   Clue Cells Wet Prep HPF POC NONE SEEN NONE SEEN   WBC, Wet Prep HPF POC MODERATE (A) NONE SEEN   Sperm NONE SEEN     IMAGING US Ob Comp Less 14 Wks  Result Date: 09/21/2017 CLINICAL DATA:  Pelvic pain. Quantitative beta HCG is pending. LMP 08/06/2017. By LMP, gestational age is 6 weeks 4 days. By LMP, EDC is 05/13/2018. Patient is gravida 6 para 2 SAB 3. EXAM: OBSTETRIC <14 WK [place] AND TRANSVAGINAL OB US TECHNIQUE: Both transabdominal and transvaginal ultrasound examinations were performed for complete evaluation of the gestation as well as the maternal uterus, adnexal regions, and pelvic are. Transvaginal technique was performed to assess early pregnancy. COMPARISON:  08/25/2017 FINDINGS: Intrauterine gestational sac: Single Yolk sac:  Visualized. Embryo:  Visualized. Cardiac Activity: Visualized. Heart Rate: 115  bpm CRL: 6.2  mm   6 w   3 d                  [place] EDC: 05/14/2018 Subchorionic hemorrhage:  None visualized. Maternal uterus/adnexae: RIGHT ovary is 2.4 x 2.4 x 2.4 centimeters and contains a corpus luteum cyst. LEFT ovary is normal in appearance, 3.0 x 1.4 x 2.3 centimeters. No free pelvic fluid. IMPRESSION: 1. Single living intrauterine embryo. 2. Today's exam confirms clinical EDC of 05/13/2018. Electronically Signed   By: Norva Pavlov M.D.   On: 09/21/2017 13:30   US Ob Transvaginal  Result Date: 09/21/2017 CLINICAL DATA:  Pelvic pain. Quantitative beta HCG is pending. LMP 08/06/2017. By LMP, gestational age is 6 weeks 4 days. By LMP, EDC is 05/13/2018. Patient is gravida 6 para 2 SAB 3. EXAM: OBSTETRIC <14 WK [place] AND TRANSVAGINAL OB US TECHNIQUE: Both transabdominal and transvaginal ultrasound examinations were performed for complete evaluation of the gestation as well as the maternal uterus, adnexal regions, and pelvic are. Transvaginal technique was performed to assess early pregnancy.  COMPARISON:  08/25/2017 FINDINGS: Intrauterine gestational sac: Single Yolk sac:  Visualized. Embryo:  Visualized. Cardiac Activity: Visualized. Heart Rate: 115  bpm CRL: 6.2  mm   6 w   3 d                  [place] EDC: 05/14/2018 Subchorionic hemorrhage:  None visualized. Maternal uterus/adnexae: RIGHT ovary is 2.4 x 2.4 x 2.4 centimeters and contains a corpus luteum cyst. LEFT ovary is normal in appearance, 3.0 x 1.4 x 2.3 centimeters. No free pelvic fluid. IMPRESSION: 1. Single living intrauterine embryo. 2. Today's exam confirms clinical EDC of 05/13/2018. Electronically Signed   By: Norva Pavlov M.D.   On: 09/21/2017 13:30    MAU COURSE Orders Placed This Encounter  Procedures  . Wet prep, genital  . US OB Transvaginal  . US OB Comp Less 14 Wks  . Urinalysis, Routine w reflex microscopic  . CBC  . hCG, quantitative, pregnancy  . Discharge patient   Meds ordered this encounter  Medications  . ranitidine (ZANTAC) 150 MG tablet    Sig: Take 1 tablet (150 mg total) by mouth 2 (two) times daily.    Dispense:  60 tablet    Refill:  0    Order Specific Question:   Supervising Provider    Answer:   Tilda Burrow [[id number]]  . metoCLOPramide (  REGLAN) 10 MG tablet    Sig: Take 1 tablet (10 mg total) by mouth every 8 (eight) hours as needed for nausea.    Dispense:  30 tablet    Refill:  0    Order Specific Question:   Supervising Provider    Answer:   Tilda Burrow [[id number]]    MDM +UPT UA, wet prep, GC/chlamydia, CBC, ABO/Rh, quant hCG, and [place] today to rule out ectopic pregnancy Ultrasound shows IUP with cardiac activity O positive  ASSESSMENT 1. Vaginal discharge during pregnancy in first trimester   2. Abdominal pain during pregnancy, first trimester   3. Normal IUP (intrauterine pregnancy) on prenatal ultrasound, first trimester   4. Nausea and vomiting during pregnancy prior to [redacted] weeks gestation     PLAN Discharge home in stable condition. SAB precautions  Follow-up  Information    [facility].   Specialty:  Obstetrics and Gynecology Contact information: [address] [place] [address] [phone]         Allergies as of 09/21/2017   No Known Allergies     Medication List    STOP taking these medications   ibuprofen 600 MG tablet Commonly known as:  ADVIL,MOTRIN   naproxen 500 MG tablet Commonly known as:  NAPROSYN     TAKE these medications   metoCLOPramide 10 MG tablet Commonly known as:  REGLAN Take 1 tablet (10 mg total) by mouth every 8 (eight) hours as needed for nausea.   ranitidine 150 MG tablet Commonly known as:  ZANTAC Take 1 tablet (150 mg total) by mouth 2 (two) times daily.        Judeth Horn, NP 09/21/2017  5:37 PM

## 2017-09-21 NOTE — MAU Note (Signed)
Been having pain for the past 2 days, and brown discharge.  Has been dizzy at times.

## 2017-09-22 LAB — GC/CHLAMYDIA PROBE AMP (~~LOC~~) NOT AT ARMC
CHLAMYDIA, DNA PROBE: NEGATIVE
NEISSERIA GONORRHEA: NEGATIVE

## 2017-09-22 LAB — HIV ANTIBODY (ROUTINE TESTING W REFLEX): HIV SCREEN 4TH GENERATION: NONREACTIVE

## 2017-09-25 ENCOUNTER — Telehealth: Payer: Self-pay | Admitting: General Practice

## 2017-09-25 NOTE — Telephone Encounter (Signed)
Patient called into front office stating she was seen at MAU earlier this week and was prescribed two medications for nausea. Patient states she still has a metallic taste in her mouth and is still getting nauseous some. Discussed with patient the metallic taste is common and will eventually go away but there isn't anything we can prescribe for that. Recommended she continue medications, eat small frequent meals, try solids before liquids, and discussed BRAT diet. Patient verbalized understanding & had no questions.

## 2017-10-01 ENCOUNTER — Ambulatory Visit: Payer: Medicaid Other | Admitting: Obstetrics & Gynecology

## 2017-10-05 ENCOUNTER — Encounter (HOSPITAL_COMMUNITY): Payer: Self-pay | Admitting: Emergency Medicine

## 2017-10-05 ENCOUNTER — Ambulatory Visit (HOSPITAL_COMMUNITY)
Admission: EM | Admit: 2017-10-05 | Discharge: 2017-10-05 | Disposition: A | Discharge status: Home / Self Care | Payer: Medicaid Other | Attending: Family Medicine | Admitting: Family Medicine

## 2017-10-05 DIAGNOSIS — O210 Mild hyperemesis gravidarum: Secondary | ICD-10-CM

## 2017-10-05 DIAGNOSIS — R438 Other disturbances of smell and taste: Secondary | ICD-10-CM

## 2017-10-05 MED ORDER — ESOMEPRAZOLE MAGNESIUM 40 MG PO CPDR: 40.0000 mg | DELAYED_RELEASE_CAPSULE | Freq: Every day | ORAL | 0 refills | Status: DC

## 2017-10-05 NOTE — ED Triage Notes (Signed)
Pt states for the last three weeks "I've had this discomfort in my mouth, a bitter taste, and a bad smell, and i'm spitting all the time". Pt is [redacted] weeks pregnant.

## 2017-10-05 NOTE — ED Provider Notes (Signed)
[facility]    CSN: [id number] Arrival date & time: 10/05/17  1053     History   Chief Complaint Chief Complaint  Patient presents with  . Pregnant  . Mouth Problem    HPI Darlene Duncan is a 42 y.o. female.   Darlene Duncan presents today with complaints of foul taste in her mouth which is constant for the past three weeks. Makes her feel like spitting her saliva. Has had nausea and occasional vomiting. [redacted] weeks pregnant. No abdominal pain or bleeding. Was provided with zantac and reglan but feels these made her feel worse so has not been taking in the past three days. Is not taking a multivitamin. No fevers. No URI symptoms. Decreased intake due to poor taste and due to nausea. No sore throat. Has been seen in MAU with ultrasound showing intrauterine pregnancy with cardiac activity, on 9/2. Has first ob appointment on 10/2. Hx of food allergies, anemia, bradycardia, miscarriage and d&c.     ROS per HPI.      Past Medical History:  Diagnosis Date  . Allergy    many food allergies  . Anemia   . Bradycardia    no meds  . Chronic kidney disease    cyst  . Headache    otc med  . Miscarriage   . Thrombosis 2013   right foot, superficial; surgically removed    Patient Active Problem List   Diagnosis Date Noted  . Post-operative state 10/17/2016  . Health care maintenance 10/17/2016  . 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 CESAREAN SECTION     x 2  . CHOLECYSTECTOMY    . DILATION AND CURETTAGE OF UTERUS  02/2016   MAB  . DILATION AND EVACUATION N/A 09/26/2016   Procedure: DILATATION AND EVACUATION FOR MISSED AB;  Surgeon: Hermina Staggers, MD;  Location: [facility];  Service: Gynecology;  Laterality: N/A;  . OVARIAN CYST SURGERY    . UPPER GI ENDOSCOPY      OB History    Gravida  6   Para  2   Term  2   Preterm  0   AB  3   Living  2     SAB  3   TAB  0   Ectopic  0   Multiple    0   Live Births  2            Home Medications    Prior to Admission medications   Medication Sig Start Date End Date Taking? Authorizing Provider  esomeprazole (NEXIUM) 40 MG capsule Take 1 capsule (40 mg total) by mouth daily. 10/05/17   Georgetta Haber, NP  metoCLOPramide (REGLAN) 10 MG tablet Take 1 tablet (10 mg total) by mouth every 8 (eight) hours as needed for nausea. 09/21/17   Judeth Horn, NP  ranitidine (ZANTAC) 150 MG tablet Take 1 tablet (150 mg total) by mouth 2 (two) times daily. 09/21/17   Judeth Horn, NP    Family History Family History  Problem Relation Age of Onset  . Benign prostatic hyperplasia Father   . Cancer Maternal Grandmother        stomach cancer    Social History Social History   Tobacco Use  . Smoking status: Never Smoker  . Smokeless tobacco: Never Used  Substance Use Topics  . Alcohol use: Yes    Comment: rarely  . Drug use: No  Allergies   Patient has no known allergies.   Review of Systems Review of Systems   Physical Exam Triage Vital Signs ED Triage Vitals  Enc Vitals Group     BP 10/05/17 1157 (!) 100/43     Pulse Rate 10/05/17 1157 (!) 53     Resp 10/05/17 1157 16     Temp 10/05/17 1157 98.4 F (36.9 C)     Temp src --      SpO2 10/05/17 1157 100 %     Weight --      Height --      Head Circumference --      Peak Flow --      Pain Score 10/05/17 1159 0     Pain Loc --      Pain Edu? --      Excl. in GC? --    No data found.  Updated Vital Signs BP (!) 100/43   Pulse (!) 53   Temp 98.4 F (36.9 C)   Resp 16   LMP 08/06/2017   SpO2 100%    Physical Exam  Constitutional: She is oriented to person, place, and time. She appears well-developed and well-nourished. No distress.  HENT:  Head: Normocephalic and atraumatic.  Right Ear: External ear normal.  Left Ear: External ear normal.  Mouth/Throat: Uvula is midline and oropharynx is clear and moist. Mucous membranes are dry. No oral lesions. No  uvula swelling. No oropharyngeal exudate or posterior oropharyngeal erythema. No tonsillar exudate.  Cardiovascular: Normal rate, regular rhythm and normal heart sounds.  Pulmonary/Chest: Effort normal and breath sounds normal.  Neurological: She is alert and oriented to person, place, and time.  Skin: Skin is warm and dry.     UC Treatments / Results  Labs (all labs ordered are listed, but only abnormal results are displayed) Labs Reviewed - No data to display  EKG None  Radiology No results found.  Procedures Procedures (including critical care time)  Medications Ordered in UC Medications - No data to display  Initial Impression / Assessment and Plan / UC Course  I have reviewed the triage vital signs and the nursing notes.  Pertinent labs & imaging results that were available during my care of the patient were reviewed by me and considered in my medical decision making (see chart for details).     No acute findings on physical exam. Lips are dry, discussed ways to increase fluid intake. Will try switching to nexium daily to see if this provides any relief. B6 recommended to help with nausea. Encouraged other supportive cares as well to help with symptoms. To continue to follow up with OB as scheduled. Return precautions provided. Patient verbalized understanding and agreeable to plan.   Final Clinical Impressions(s) / UC Diagnoses   Final diagnoses:  Morning sickness  Bad taste in mouth     Discharge Instructions     We will try switching your reflux medication to see if this is helpful with poor taste in your mouth. To be taken once a day.  Vitamin B6 50mg  can be taken twice a day to help with nausea as well.  Dramamine at night before bed may also help with nausea.  Sucking on sour candies may help with your bad taste as well.  Increase your fluid intake, small frequent sips of fluid to ensure adequate hydration.  Please follow up with your Ob as scheduled. Return  sooner if further dehydration, weakness, loss of conciousness   ED Prescriptions  Medication Sig Dispense Auth. Provider   esomeprazole (NEXIUM) 40 MG capsule Take 1 capsule (40 mg total) by mouth daily. 30 capsule Georgetta Haber, NP     Controlled Substance Prescriptions [place] Controlled Substance Registry consulted? Not Applicable   Georgetta Haber, NP 10/05/17 1256

## 2017-10-05 NOTE — Discharge Instructions (Signed)
We will try switching your reflux medication to see if this is helpful with poor taste in your mouth. To be taken once a day.  Vitamin B6 50mg  can be taken twice a day to help with nausea as well.  Dramamine at night before bed may also help with nausea.  Sucking on sour candies may help with your bad taste as well.  Increase your fluid intake, small frequent sips of fluid to ensure adequate hydration.  Please follow up with your Ob as scheduled. Return sooner if further dehydration, weakness, loss of conciousness

## 2017-10-21 ENCOUNTER — Ambulatory Visit (INDEPENDENT_AMBULATORY_CARE_PROVIDER_SITE_OTHER): Payer: Medicaid Other | Admitting: Obstetrics & Gynecology

## 2017-10-21 VITALS — BP 108/52 | HR 73 | Wt 138.0 lb

## 2017-10-21 DIAGNOSIS — O09891 Supervision of other high risk pregnancies, first trimester: Secondary | ICD-10-CM

## 2017-10-21 DIAGNOSIS — O34219 Maternal care for unspecified type scar from previous cesarean delivery: Secondary | ICD-10-CM

## 2017-10-21 DIAGNOSIS — O09529 Supervision of elderly multigravida, unspecified trimester: Secondary | ICD-10-CM | POA: Insufficient documentation

## 2017-10-21 LAB — POCT URINALYSIS DIP (DEVICE)
Glucose, UA: NEGATIVE mg/dL
Ketones, ur: 40 mg/dL — AB
LEUKOCYTES UA: NEGATIVE
Nitrite: NEGATIVE
Protein, ur: NEGATIVE mg/dL
UROBILINOGEN UA: 0.2 mg/dL (ref 0.0–1.0)
pH: 5.5 (ref 5.0–8.0)

## 2017-10-21 MED ORDER — GLYCOPYRROLATE 2 MG PO TABS: 2.0000 mg | ORAL_TABLET | Freq: Three times a day (TID) | ORAL | 3 refills | Status: AC | PRN

## 2017-10-21 NOTE — Progress Notes (Signed)
  Subjective:    Darlene Duncan is being seen today for her first obstetrical visit.  This is a planned pregnancy. She is at [redacted]w[redacted]d gestation. Her obstetrical history is significant for advanced maternal age. Patient does intend to breast feed. Pregnancy history fully reviewed.  Patient reports so much spitting that it is driving her a little crazy.  Review of Systems:   Review of Systems  Objective:     BP (!) 108/52   Pulse 73   Wt 138 lb (62.6 kg)   LMP 08/06/2017   BMI 22.27 kg/m  Physical Exam  Exam  Heart- rrr Lungs- CTAB Abd- benign FHR 170 on bedside u/s (ipad)    Assessment:    Pregnancy: [id number] Patient Active Problem List   Diagnosis Date Noted  . Supervision of other high risk pregnancies, first trimester 10/21/2017  . Antepartum multigravida of advanced maternal age 21/02/2017  . Previous cesarean delivery affecting pregnancy, antepartum 10/21/2017  . Post-operative state 10/17/2016  . Health care maintenance 10/17/2016  . 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 Prenatal vitamins. Problem list reviewed and updated. AFP3 discussed: declined. Role of ultrasound in pregnancy discussed; fetal survey: requested Follow up in 4 weeks for NIPS However, she plans to move back where she has relatives in [place] in the next 2-3 weeks. Robinul prescribed.  She declines a flu vaccine.  Allie Bossier 10/21/2017

## 2017-10-21 NOTE — Progress Notes (Signed)
FHR obtained via bedside ultrasound, FHR 170bpm.

## 2017-10-23 LAB — CULTURE, OB URINE

## 2017-10-23 LAB — URINE CULTURE, OB REFLEX

## 2017-10-28 LAB — OBSTETRIC PANEL, INCLUDING HIV
ANTIBODY SCREEN: NEGATIVE
BASOS ABS: 0 10*3/uL (ref 0.0–0.2)
Basos: 1 %
EOS (ABSOLUTE): 0.1 10*3/uL (ref 0.0–0.4)
Eos: 4 %
HIV Screen 4th Generation wRfx: NONREACTIVE
Hematocrit: 35.2 % (ref 34.0–46.6)
Hemoglobin: 11.3 g/dL (ref 11.1–15.9)
Immature Grans (Abs): 0 10*3/uL (ref 0.0–0.1)
Immature Granulocytes: 0 %
LYMPHS: 33 %
Lymphocytes Absolute: 1.1 10*3/uL (ref 0.7–3.1)
MCH: 26.8 pg (ref 26.6–33.0)
MCHC: 32.1 g/dL (ref 31.5–35.7)
MCV: 84 fL (ref 79–97)
MONOS ABS: 0.2 10*3/uL (ref 0.1–0.9)
Monocytes: 7 %
NEUTROS PCT: 55 %
Neutrophils Absolute: 1.8 10*3/uL (ref 1.4–7.0)
Platelets: 204 10*3/uL (ref 150–450)
RBC: 4.21 x10E6/uL (ref 3.77–5.28)
RDW: 12.4 % (ref 12.3–15.4)
RPR Ser Ql: NONREACTIVE
Rh Factor: POSITIVE
Rubella Antibodies, IGG: 23.9 index (ref >0.99)
WBC: 3.3 10*3/uL — ABNORMAL LOW (ref 3.4–10.8)

## 2017-10-28 LAB — CYSTIC FIBROSIS GENE TEST

## 2017-10-28 LAB — HEPATITIS B SURFACE AG, CONFIRM: HBsAg Confirmation: POSITIVE — AB

## 2017-10-28 LAB — SMN1 COPY NUMBER ANALYSIS (SMA CARRIER SCREENING)

## 2017-10-28 LAB — HEMOGLOBINOPATHY EVALUATION
Ferritin: 111 ng/mL (ref 15–150)
HGB A: 97.5 % (ref 96.4–98.8)
HGB F QUANT: 0 % (ref 0.0–2.0)
HGB SOLUBILITY: NEGATIVE
Hgb A2 Quant: 2.5 % (ref 1.8–3.2)
Hgb C: 0 %
Hgb S: 0 %
Hgb Variant: 0 %

## 2017-10-28 LAB — HEMOGLOBIN A1C
Est. average glucose Bld gHb Est-mCnc: 117 mg/dL
HEMOGLOBIN A1C: 5.7 % — AB (ref 4.8–5.6)

## 2017-10-29 ENCOUNTER — Encounter: Payer: Medicaid Other | Admitting: Obstetrics and Gynecology

## 2017-10-29 ENCOUNTER — Telehealth: Payer: Self-pay | Admitting: General Practice

## 2017-10-29 NOTE — Telephone Encounter (Signed)
-----   Message from Allie Bossier, MD sent at 10/29/2017  8:59 AM EDT ----- She needs to have a 2 hour GTT asap. Thanks

## 2017-10-29 NOTE — Telephone Encounter (Signed)
Called patient & informed her of results. Patient verbalized understanding & states she has already moved to [place] but will let her doctor know.

## 2017-10-30 ENCOUNTER — Telehealth: Payer: Self-pay

## 2017-10-30 NOTE — Telephone Encounter (Signed)
Pt called nurse line today @ 10:07am wanting to know why results are not showing on My Chart. Advised that they will once Dr. Vicente Serene them. Pt also wanted to get Gluco #'s again before she re-took the test in [place]. Went over ,Pt verbalized understanding.

## 2017-11-06 ENCOUNTER — Encounter: Payer: Medicaid Other | Admitting: Obstetrics & Gynecology

## 2018-08-05 ENCOUNTER — Encounter (HOSPITAL_COMMUNITY): Payer: Self-pay

## 2020-02-29 IMAGING — US US TRANSVAGINAL NON-OB
1 series · 15 of 25 positions shown · non-contrast
Comparison: 09/11/2016

CLINICAL DATA: Pelvic pain in a female

EXAM:
ULTRASOUND PELVIS TRANSVAGINAL
TECHNIQUE: Transvaginal ultrasound examination of the pelvis was performed
including evaluation of the uterus, ovaries, adnexal regions, and
pelvic cul-de-sac.

[Series 1: us transvaginal non-ob · 15 of 29 slices shown]
[im 1/29]
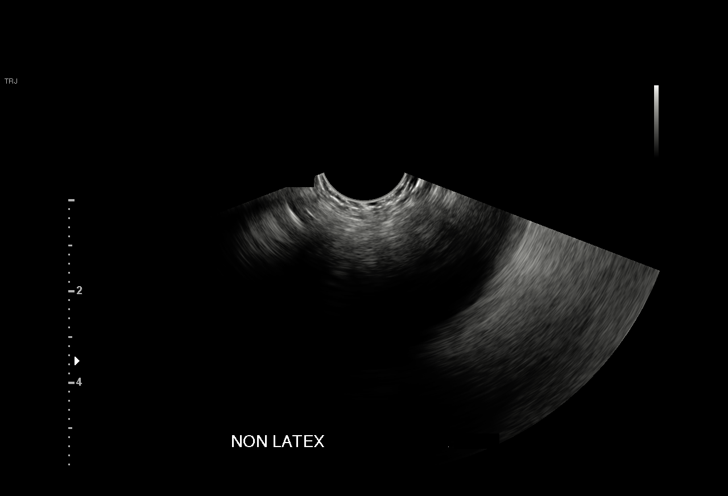
[im 3/29]
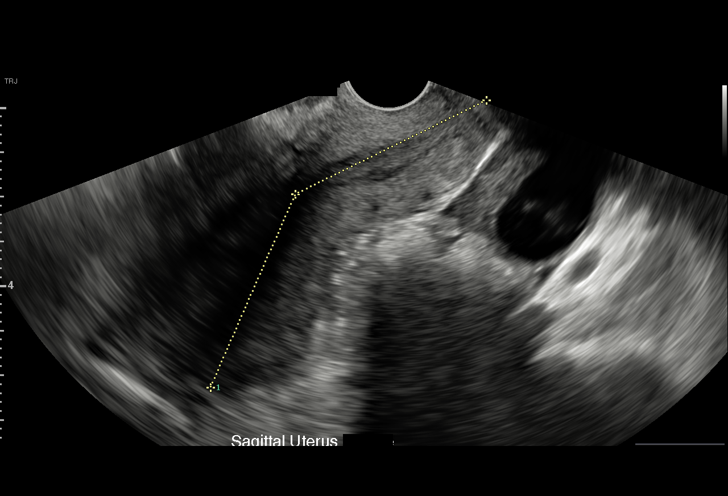
[im 5/29]
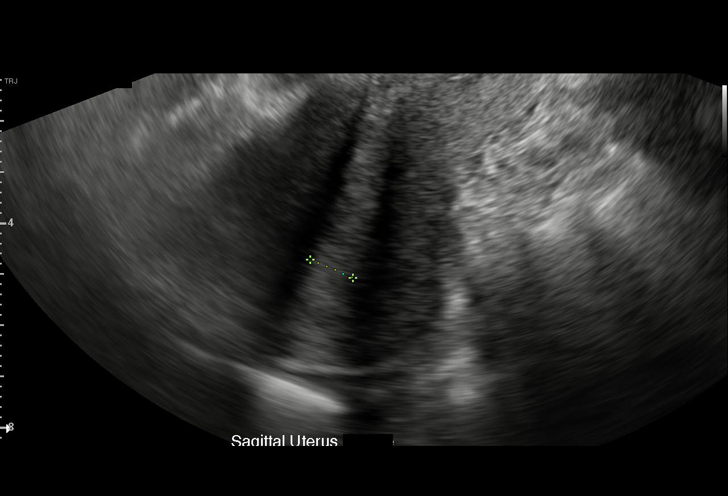
[im 6/29]
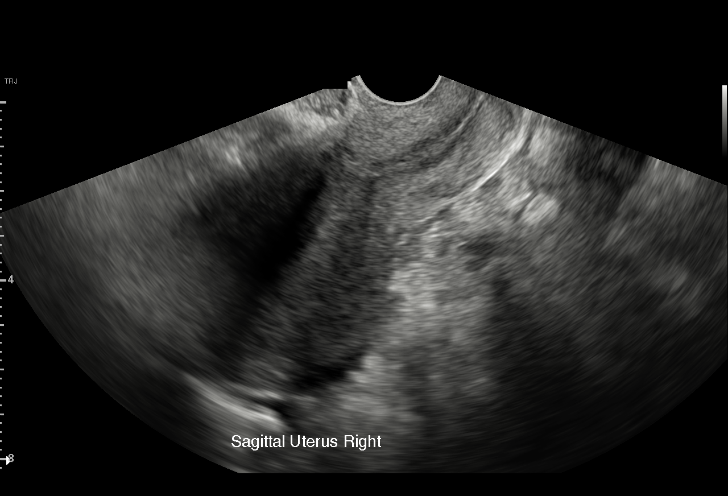
[im 9/29]
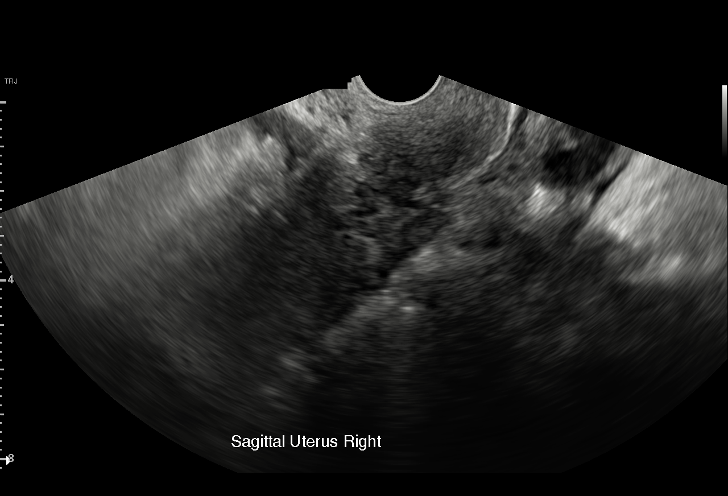
[im 11/29]
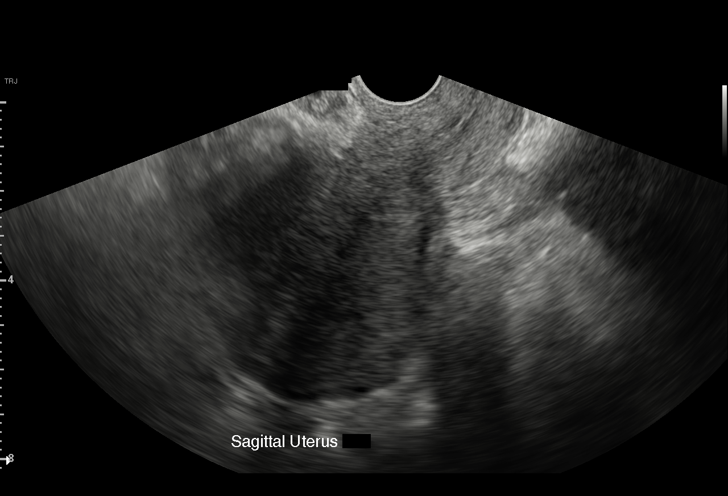
[im 12/29]
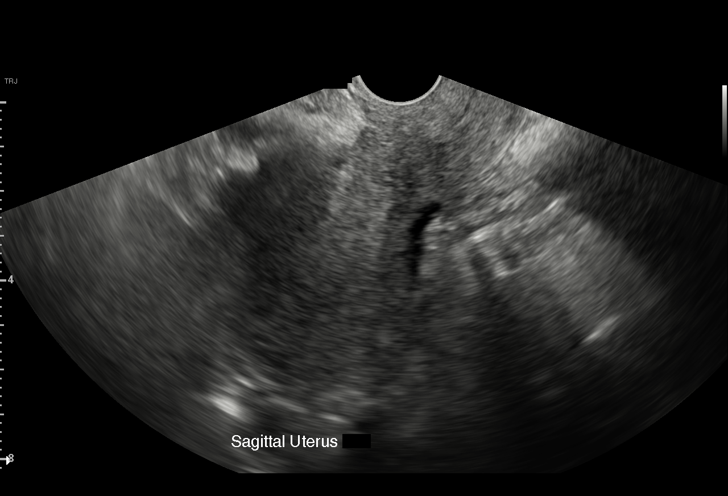
[im 15/29]
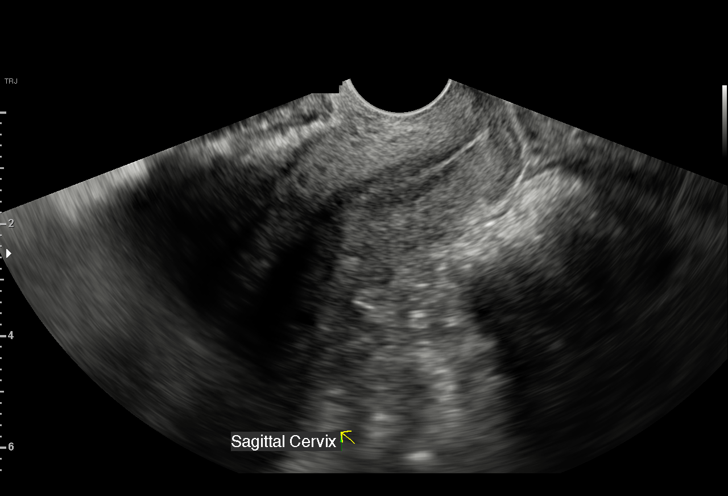
[im 17/29]
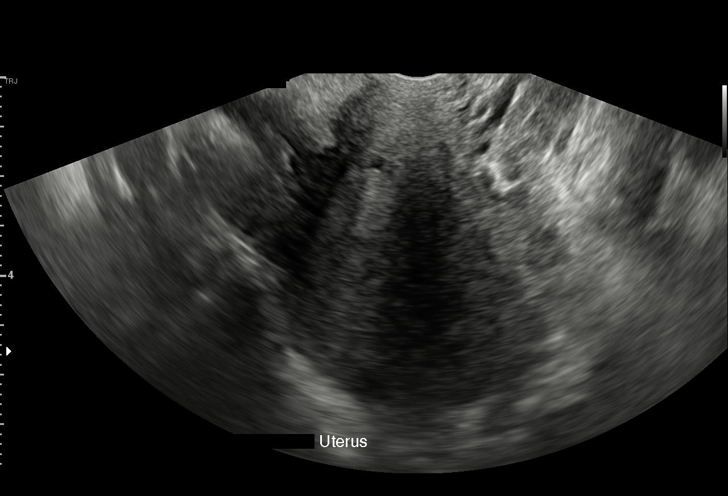
[im 18/29]
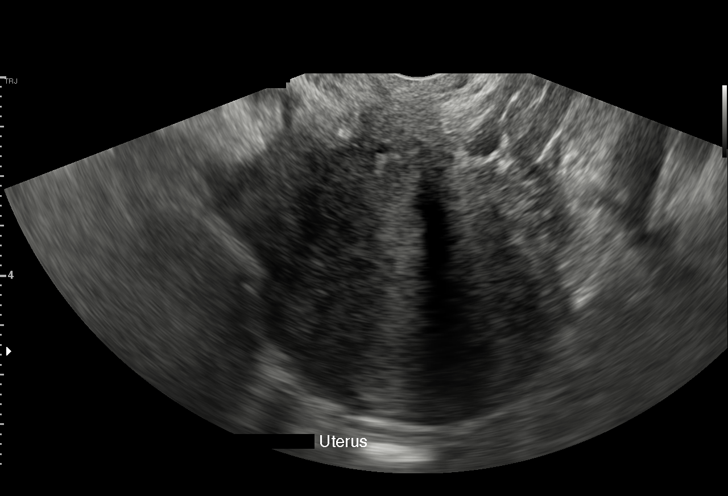
[im 20/29]
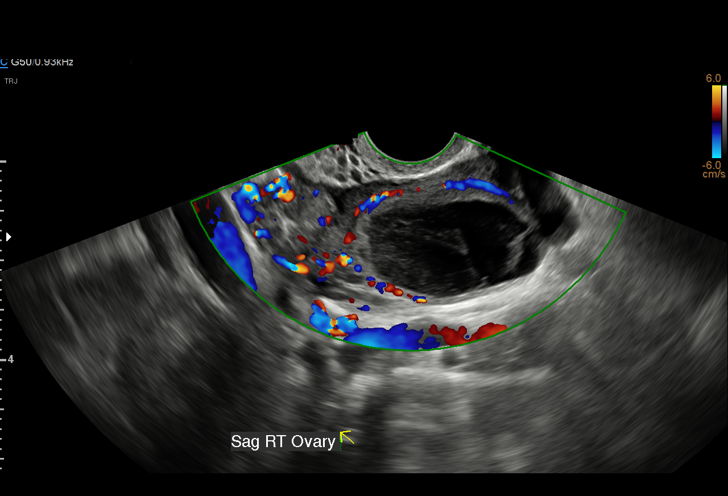
[im 23/29]
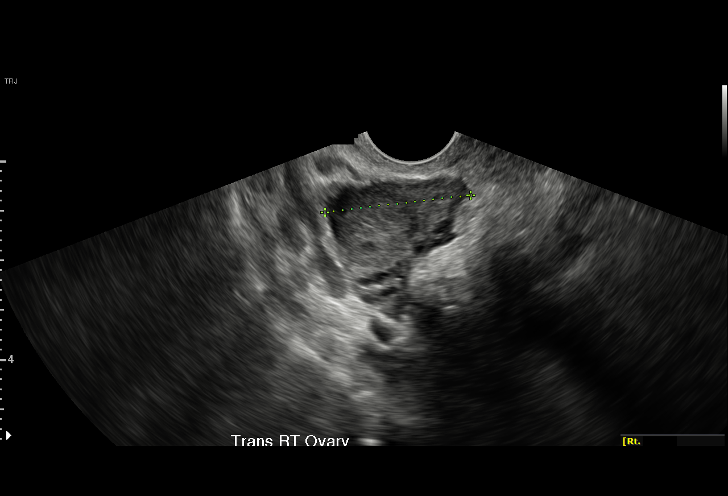
[im 24/29]
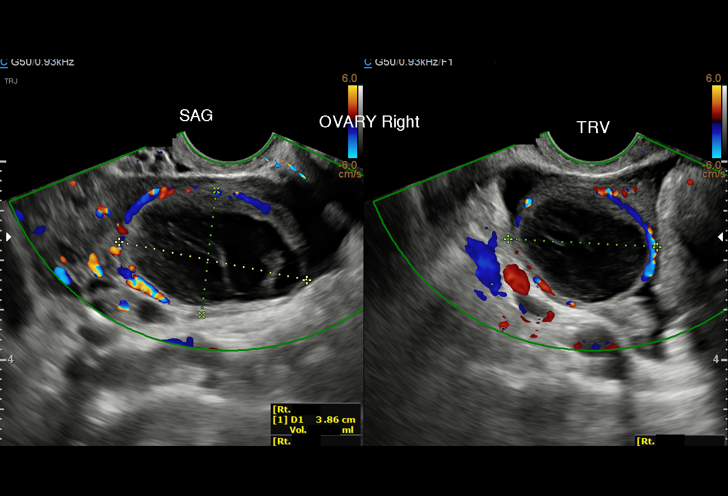
[im 26/29]
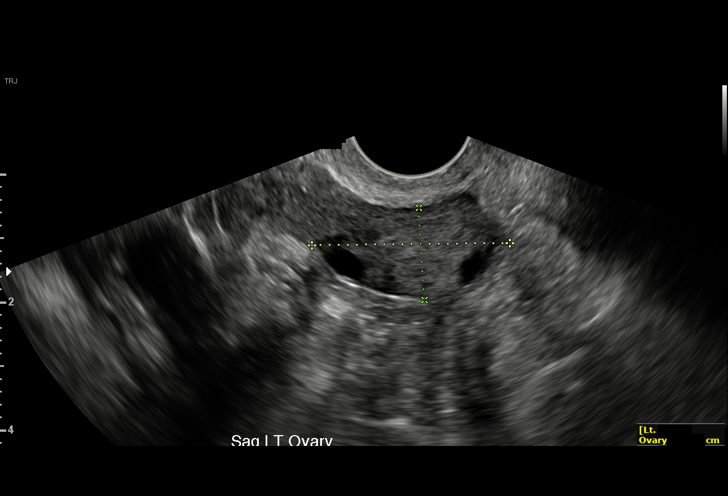
[im 29/29]
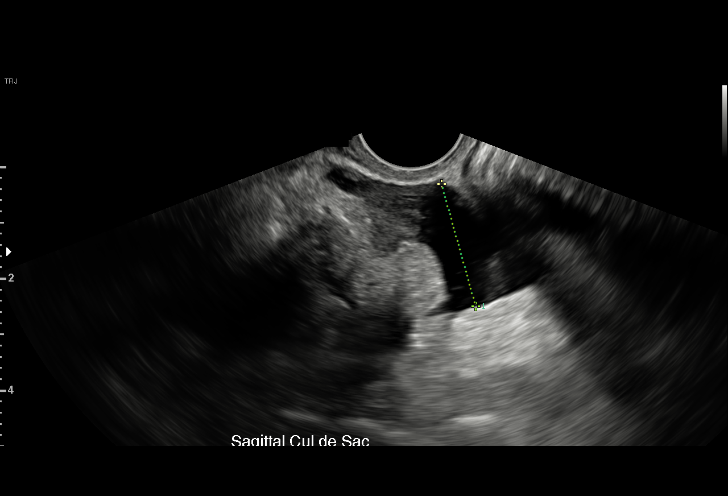

[15 of 25 positions shown; findings below may reference images not displayed]

FINDINGS: Uterus

Measurements: 9.5 x 4.5 x 5.8 cm. Retroflexed. No focal uterine
mass.

Endometrium

Thickness: 9 mm thick. Suboptimally visualized due to uterine
position. No gross fluid or focal abnormality

Right ovary

Measurements: 5.2 x 3.0 x 2.9 cm. Small complicated hypoechoic
nodule likely hemorrhagic cyst within RIGHT ovary 3.9 x 2.5 x 3.0 cm

Left ovary

Measurements: 3.1 x 1.4 x 2.6 cm.  Normal morphology without mass

Other findings: Small amount of nonspecific free pelvic fluid. No
additional adnexal masses.
IMPRESSION: Unremarkable uterus and LEFT ovary.

Probable small hemorrhagic cyst RIGHT ovary 3.9 x 2.5 x 3.0 cm
Short-interval follow up ultrasound in 6-12 weeks is recommended,
preferably during the week following the patient's normal menses to
reassess the RIGHT ovarian lesion.

## 2020-03-25 IMAGING — US US TRANSVAGINAL NON-OB
1 series · 15 of 25 positions shown · non-contrast
Comparison: 07/31/2017

CLINICAL DATA: Followup complex right ovarian cyst.

EXAM:
ULTRASOUND PELVIS TRANSVAGINAL
TECHNIQUE: Transvaginal ultrasound examination of the pelvis was performed
including evaluation of the uterus, ovaries, adnexal regions, and
pelvic cul-de-sac.

[Series 1: us transvaginal non-ob · 15 of 84 slices shown]
[im 1/84]
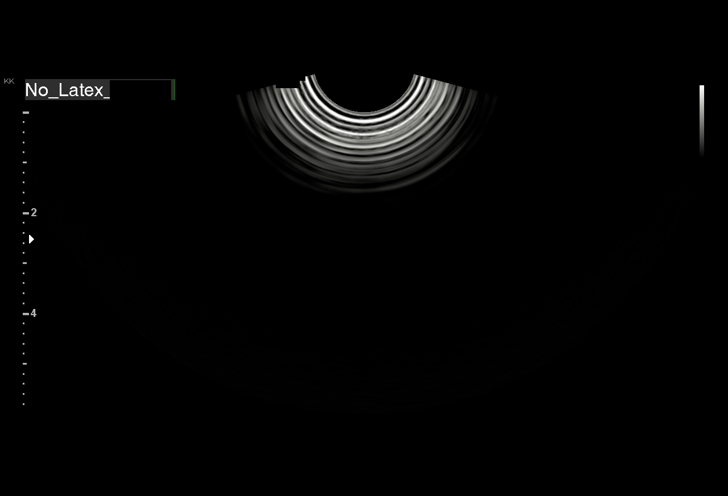
[im 7/84]
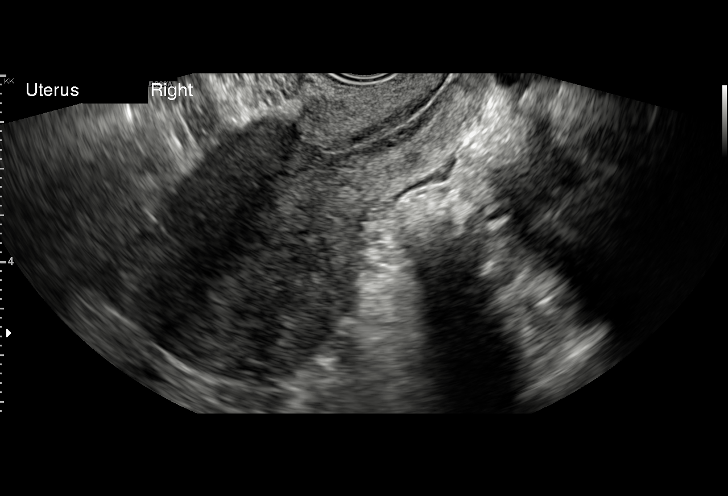
[im 14/84]
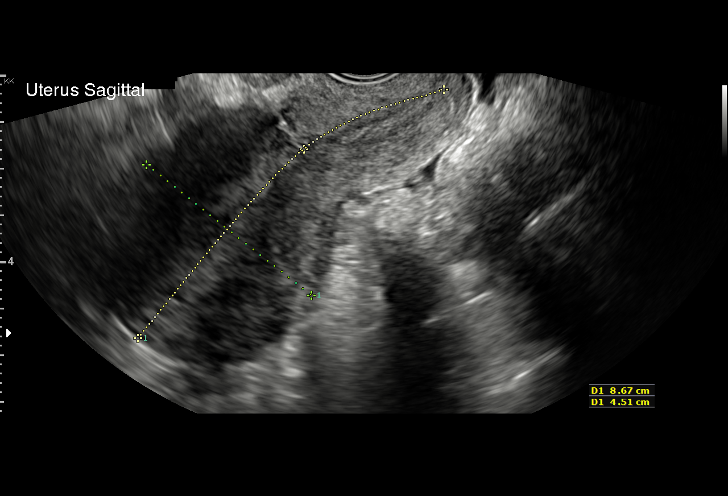
[im 18/84]
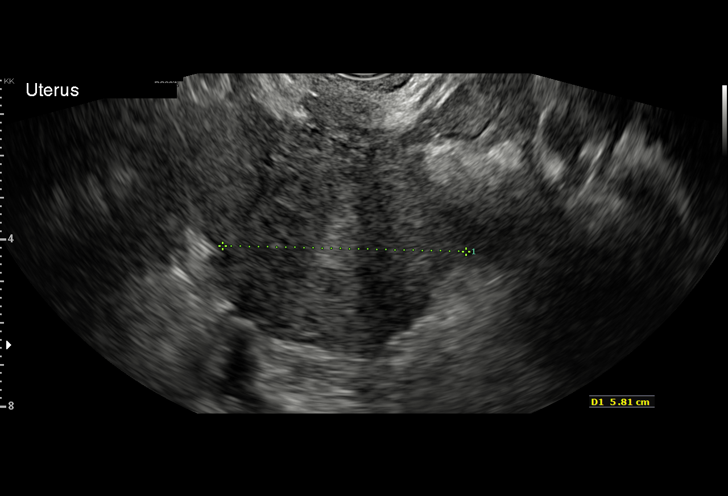
[im 25/84]
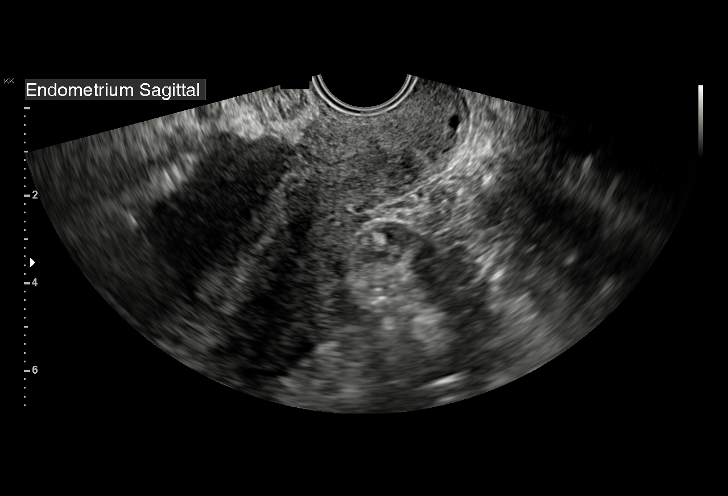
[im 32/84]
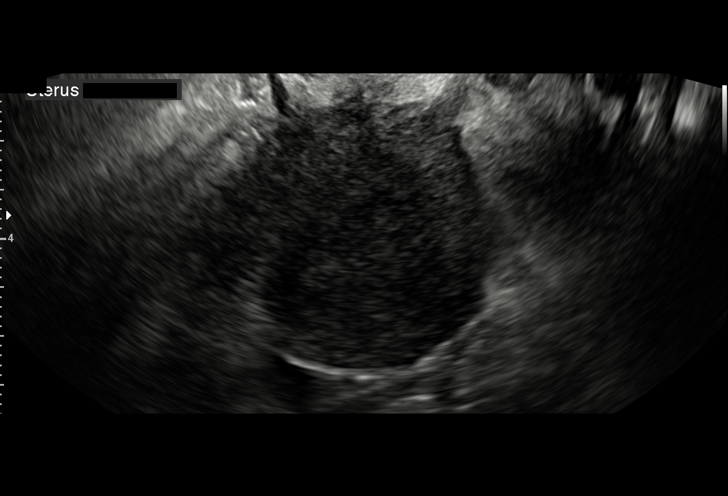
[im 35/84]
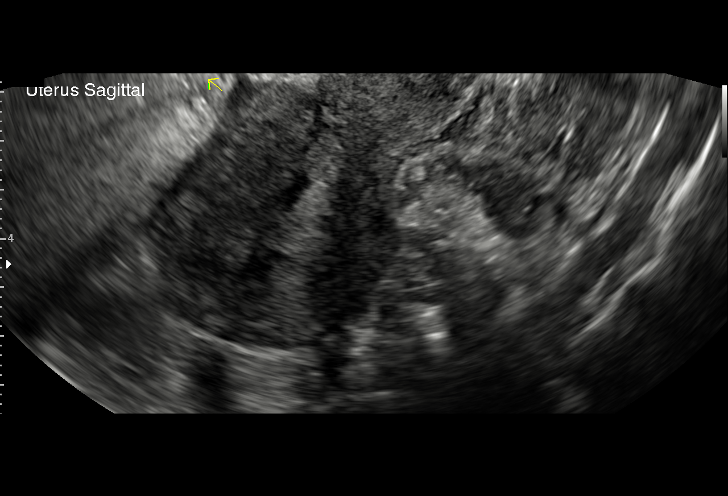
[im 42/84]
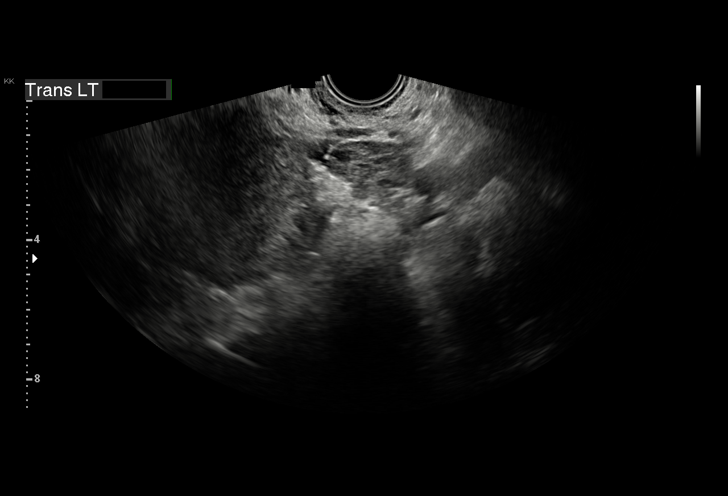
[im 49/84]
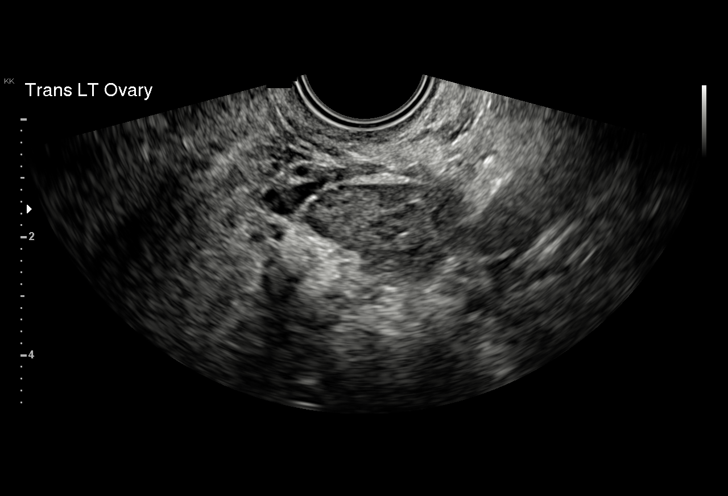
[im 52/84]
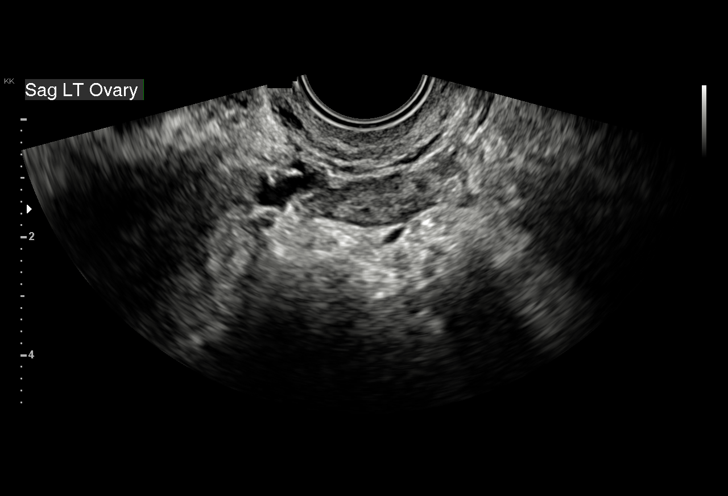
[im 59/84]
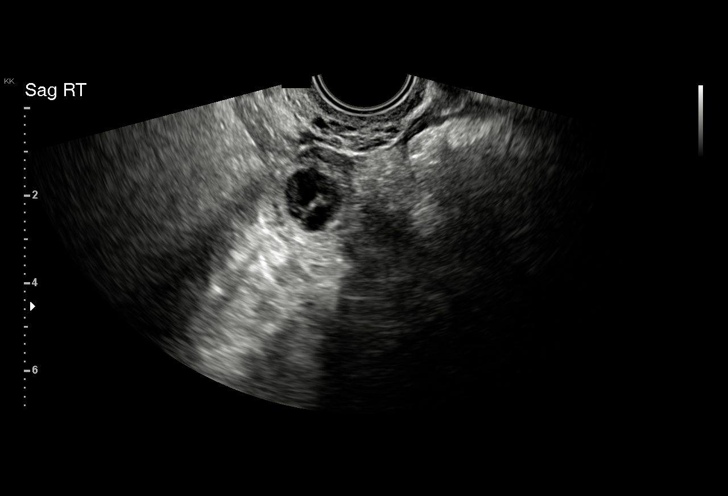
[im 66/84]
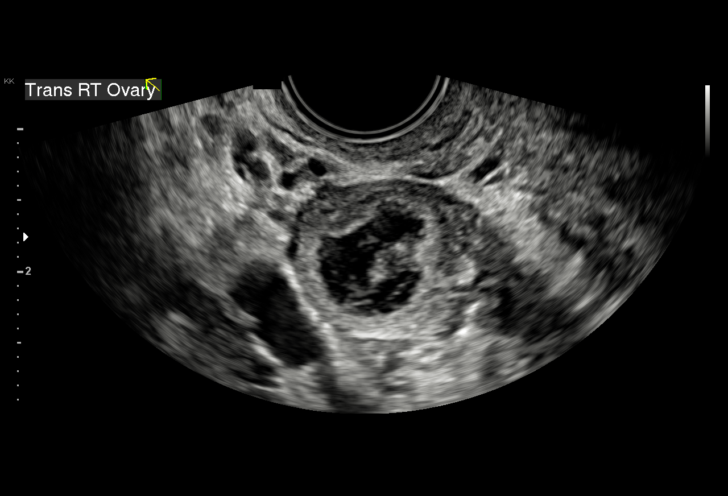
[im 70/84]
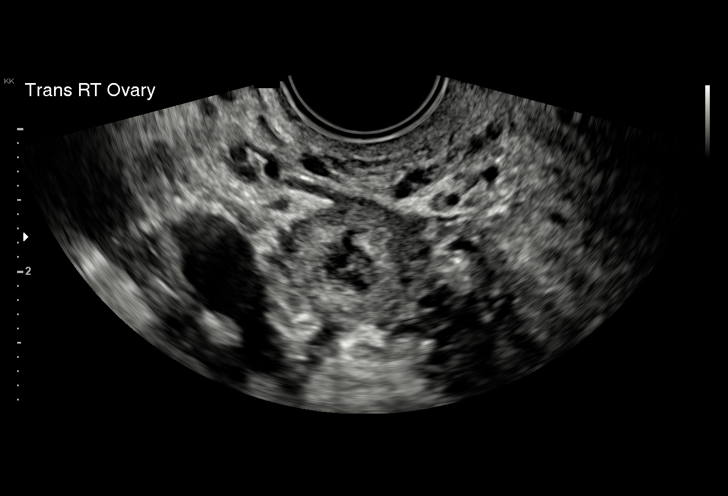
[im 77/84]
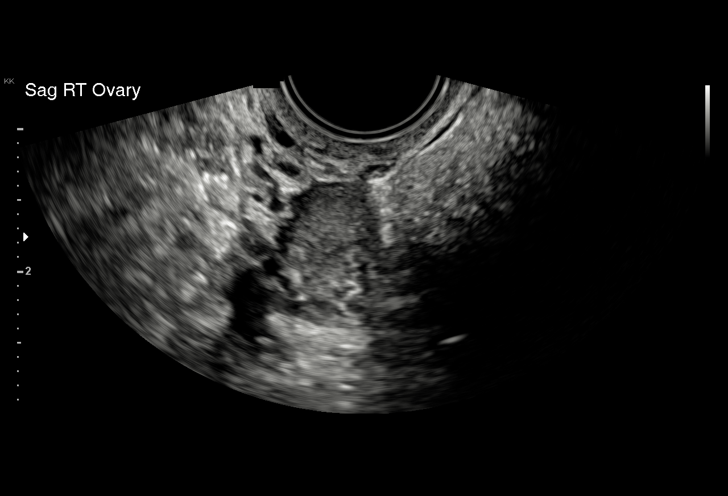
[im 84/84]
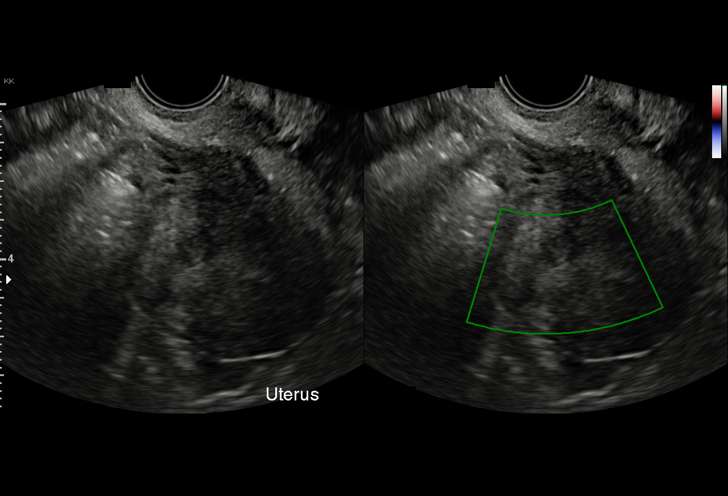

[15 of 25 positions shown; findings below may reference images not displayed]

FINDINGS: Uterus

Measurements: 8.7 x 4.5 x 5.8 cm. No fibroids or other mass
visualized. Prior C-section scar noted.

Endometrium

Thickness: 11 mm.  No focal abnormality visualized.

Right ovary

Measurements: 2.6 x 1.9 x 3.2 cm. A small hemorrhagic cyst is seen
which currently measures 2.0 x 1.7 x 1.9 cm, decreased in size from
3.9 x 2.5 x 3.0 cm on previous study.

Left ovary

Measurements: 3.8 x 1.1 x 2.4 cm. Normal appearance/no adnexal mass.

Other findings:  No abnormal free fluid
IMPRESSION: Decreased size of small right ovarian hemorrhagic cyst since
previous study. No acute findings.

## 2020-04-21 IMAGING — US US OB COMP LESS 14 WK
1 series · 15 of 28 positions shown · non-contrast
Comparison: 08/25/2017

CLINICAL DATA: Pelvic pain. Quantitative beta HCG is pending. LMP
08/06/2017. By LMP, gestational age is 6 weeks 4 days. By LMP, EDC
is 05/13/2018. Patient is gravida 6 para 2 SAB 3.

EXAM:
OBSTETRIC <14 WK US AND TRANSVAGINAL OB US
TECHNIQUE: Both transabdominal and transvaginal ultrasound examinations were
performed for complete evaluation of the gestation as well as the
maternal uterus, adnexal regions, and pelvic are. Transvaginal
technique was performed to assess early pregnancy.

[Series 1: us ob comp less 14 wk · 15 of 74 slices shown]
[im 1/74]
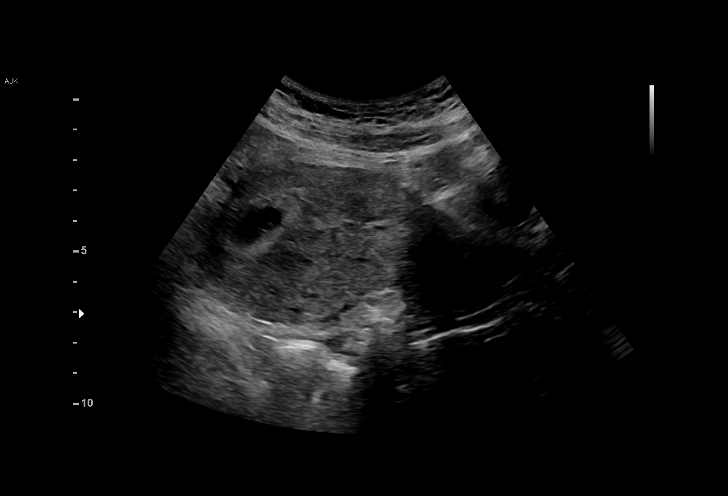
[im 6/74]
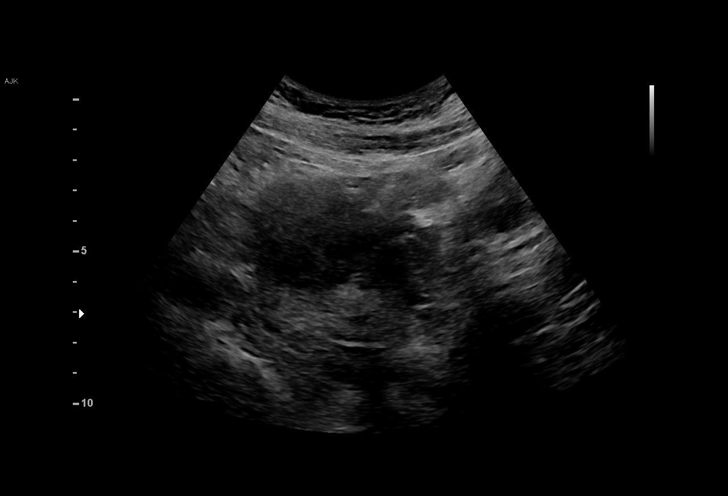
[im 11/74]
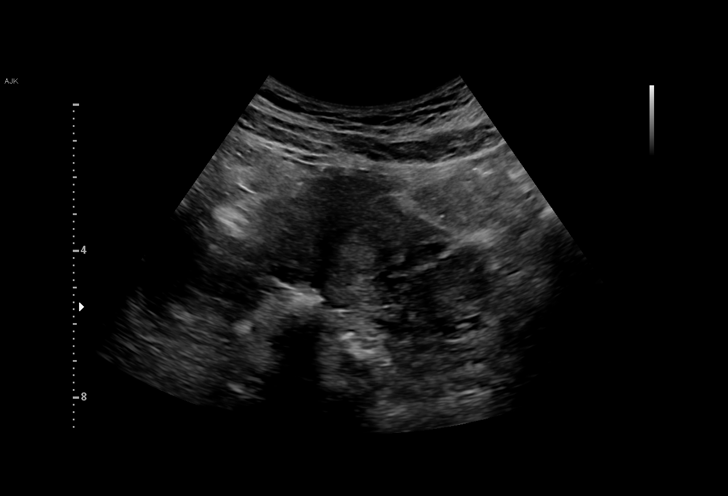
[im 17/74]
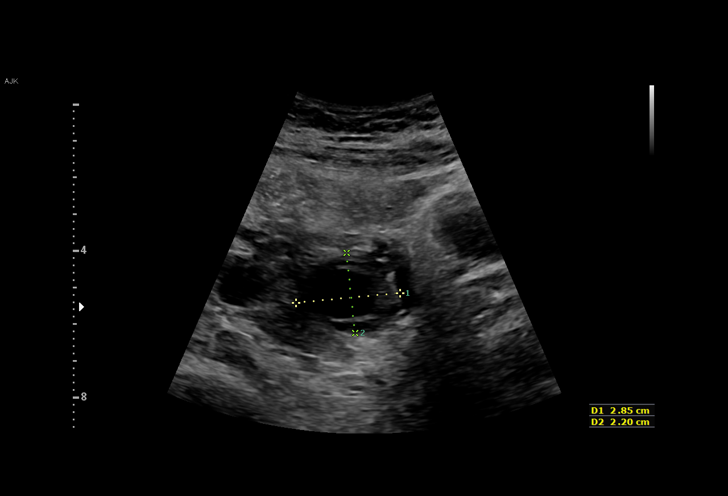
[im 22/74]
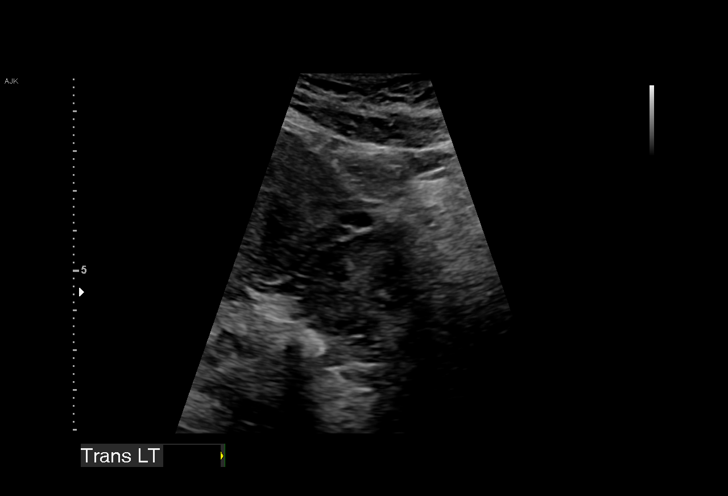
[im 28/74]
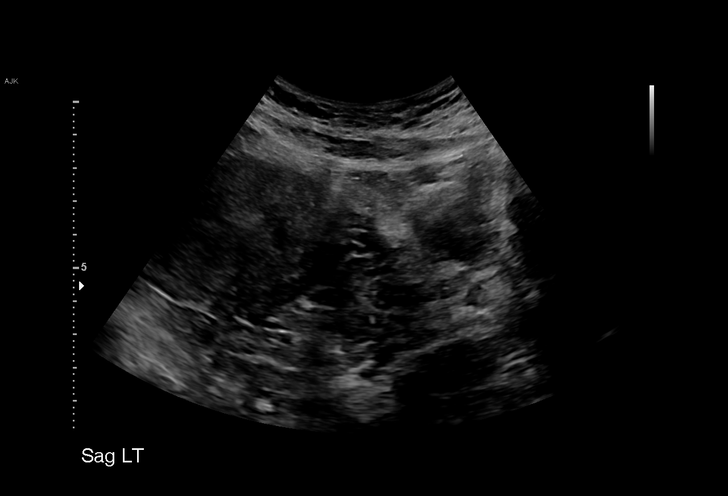
[im 33/74]
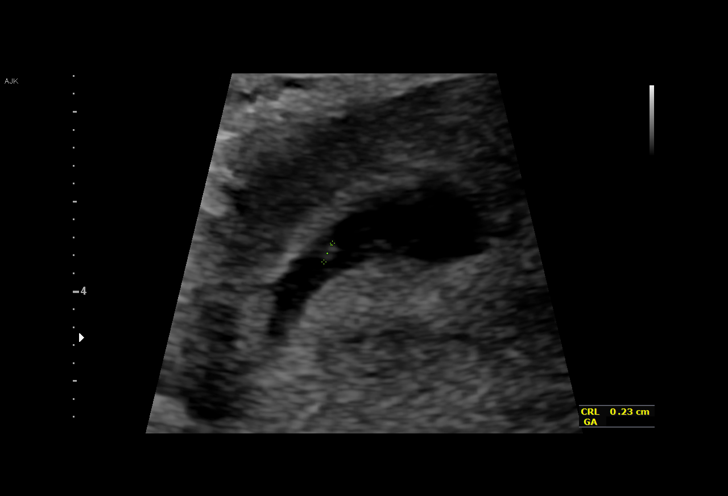
[im 38/74]
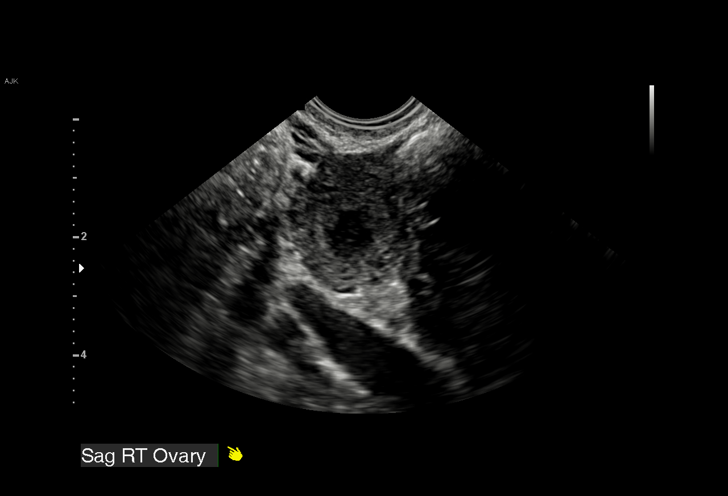
[im 41/74]
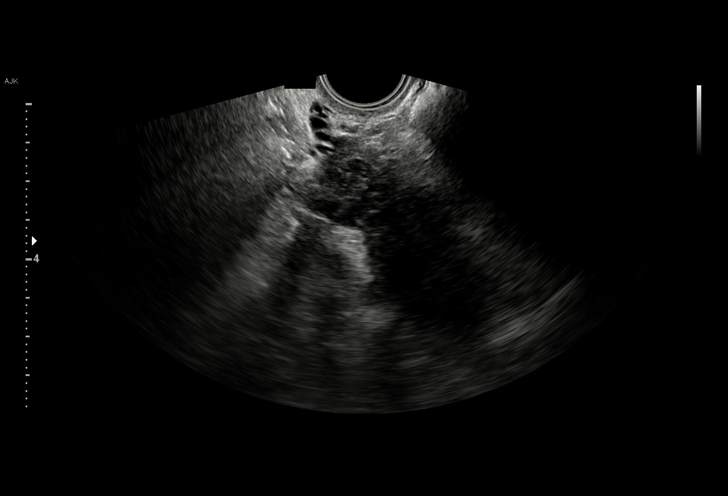
[im 46/74]
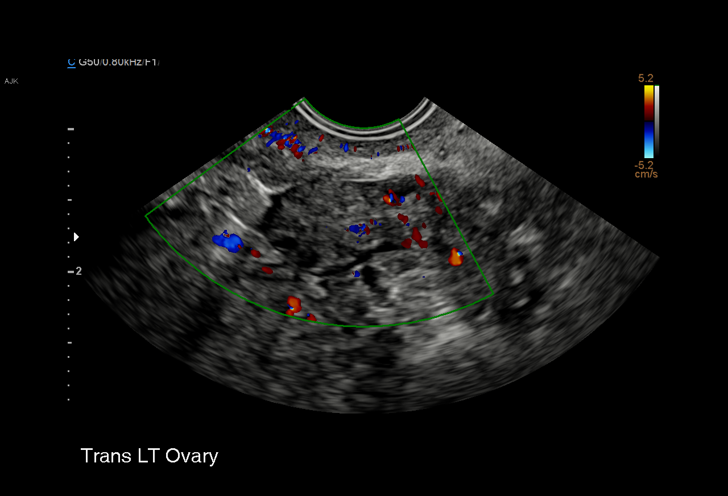
[im 52/74]
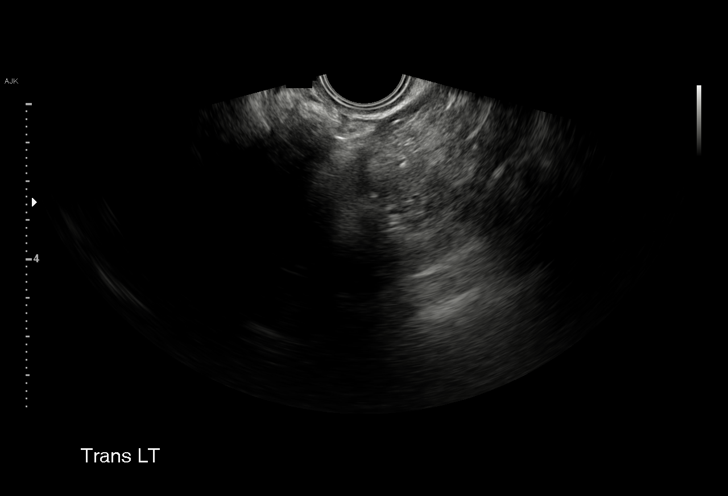
[im 57/74]
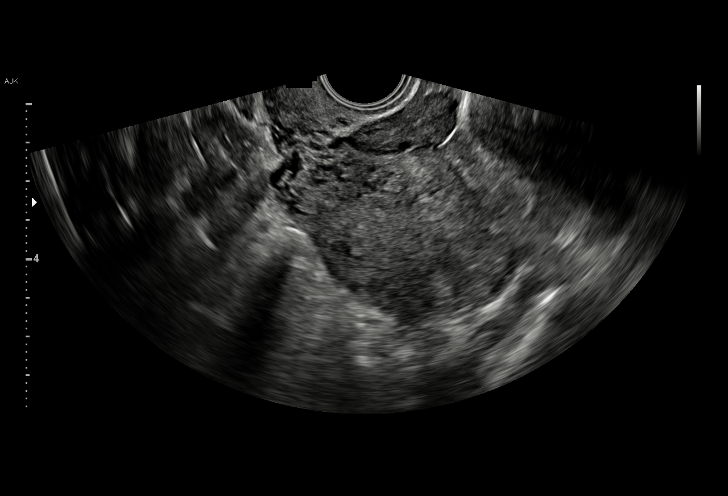
[im 63/74]
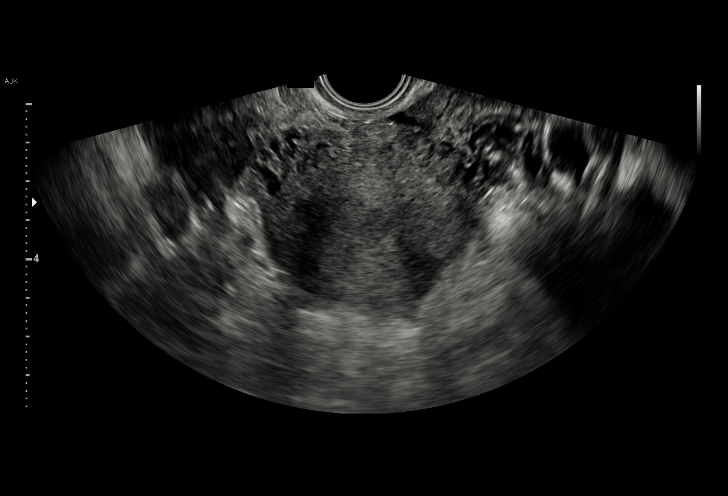
[im 68/74]
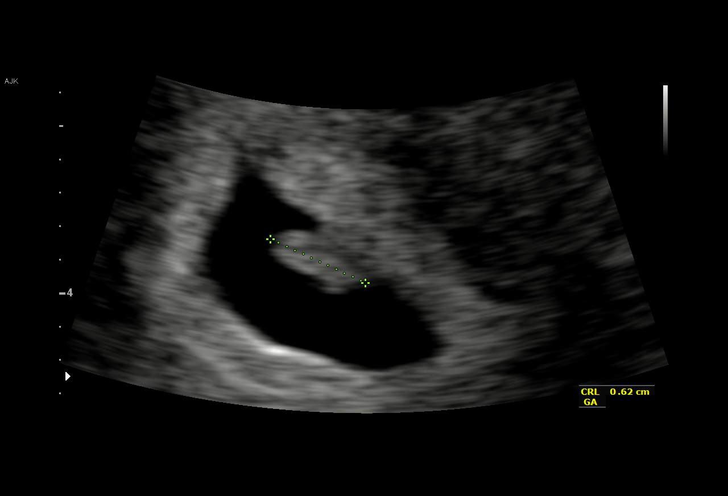
[im 74/74]
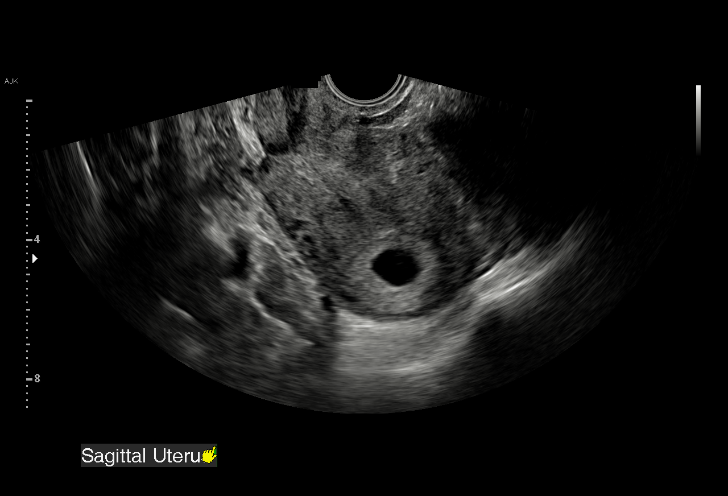

[15 of 28 positions shown; findings below may reference images not displayed]

FINDINGS: Intrauterine gestational sac: Single

Yolk sac:  Visualized.

Embryo:  Visualized.

Cardiac Activity: Visualized.

Heart Rate: 115  bpm

CRL: 6.2  mm   6 w   3 d                  US EDC: 05/14/2018

Subchorionic hemorrhage:  None visualized.

Maternal uterus/adnexae: RIGHT ovary is 2.4 x 2.4 x 2.4 centimeters
and contains a corpus luteum cyst. LEFT ovary is normal in
appearance, 3.0 x 1.4 x 2.3 centimeters. No free pelvic fluid.
IMPRESSION: 1. Single living intrauterine embryo.
2. Today's exam confirms clinical EDC of 05/13/2018.
